# Patient Record
Sex: Female | Born: 1995
Health system: Southern US, Community
[De-identification: ages and names within clinical notes are randomized; demographics above are authoritative.]

## PROBLEM LIST (undated history)

## (undated) ENCOUNTER — Inpatient Hospital Stay (HOSPITAL_COMMUNITY): Payer: Self-pay

## (undated) DIAGNOSIS — Z973 Presence of spectacles and contact lenses: Secondary | ICD-10-CM

## (undated) DIAGNOSIS — S62334A Displaced fracture of neck of fourth metacarpal bone, right hand, initial encounter for closed fracture: Secondary | ICD-10-CM

## (undated) HISTORY — PX: WISDOM TOOTH EXTRACTION: SHX21

---
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
## 2018-05-15 LAB — OB RESULTS CONSOLE ABO/RH: RH Type: POSITIVE

## 2018-05-15 LAB — OB RESULTS CONSOLE RUBELLA ANTIBODY, IGM: Rubella: IMMUNE

## 2018-05-15 LAB — OB RESULTS CONSOLE HEPATITIS B SURFACE ANTIGEN: Hepatitis B Surface Ag: NEGATIVE

## 2018-07-18 DIAGNOSIS — Z3482 Encounter for supervision of other normal pregnancy, second trimester: Secondary | ICD-10-CM | POA: Diagnosis not present

## 2018-07-18 DIAGNOSIS — Z3480 Encounter for supervision of other normal pregnancy, unspecified trimester: Secondary | ICD-10-CM | POA: Diagnosis not present

## 2018-07-18 DIAGNOSIS — Z3201 Encounter for pregnancy test, result positive: Secondary | ICD-10-CM | POA: Diagnosis not present

## 2018-07-19 DIAGNOSIS — N761 Subacute and chronic vaginitis: Secondary | ICD-10-CM | POA: Diagnosis not present

## 2018-07-19 DIAGNOSIS — Z36 Encounter for antenatal screening for chromosomal anomalies: Secondary | ICD-10-CM | POA: Diagnosis not present

## 2018-07-19 DIAGNOSIS — Z3482 Encounter for supervision of other normal pregnancy, second trimester: Secondary | ICD-10-CM | POA: Diagnosis not present

## 2018-09-18 DIAGNOSIS — Z23 Encounter for immunization: Secondary | ICD-10-CM | POA: Diagnosis not present

## 2018-09-18 DIAGNOSIS — Z3482 Encounter for supervision of other normal pregnancy, second trimester: Secondary | ICD-10-CM | POA: Diagnosis not present

## 2018-09-18 LAB — OB RESULTS CONSOLE HIV ANTIBODY (ROUTINE TESTING): HIV: NONREACTIVE

## 2018-09-18 LAB — OB RESULTS CONSOLE RPR: RPR: NONREACTIVE

## 2018-10-03 DIAGNOSIS — Z23 Encounter for immunization: Secondary | ICD-10-CM | POA: Diagnosis not present

## 2018-11-28 NOTE — L&D Delivery Note (Addendum)
Delivery Note Upon arrival, +3 station.  Pt pushed twice.  At 8:58 PM a viable female was delivered via Vaginal, Spontaneous (Presentation: direct OA, spontaneously rotated to LOP  ).  APGAR: 6, 9; weight pending  . Nose and mouth suctioned on perineum.   Placenta status: spontaneou , intact .  Cord: loose nuchal cord x 1, manually reduced   Cord pH: venous sent.  Unable to get arterial.  NICU called prior to delivery due meconium and deceleration. They arrived at ~1 minute.    Anesthesia:  Epidural and lidocaine Episiotomy: None Lacerations: Periurethral, vaginal Suture Repair: 2.0 3.0 chromic Est. Blood Loss (mL):  498  Red rubber catheter placed in urethra after betadine applied.  Stayed in place while repair completed.  Easily removed.  Concerns for hematoma on right side but expressed minimal blood once laceration repaired.  Large hymenal tag avulsed from 7-8 o'clock.  Reapproximated after vaginal tear repaired.  Deep vaginal tear in posterior and right vagina.    Mom to postpartum.  Baby to Couplet care / Skin to Skin.  Couple desires circumcision.  Placenta sent to pathology due to fetal decelerations and it felt warm.  Maternal temp was 98.1   Geryl Rankins 12/31/2018, 9:52 PM

## 2018-11-29 DIAGNOSIS — Z3483 Encounter for supervision of other normal pregnancy, third trimester: Secondary | ICD-10-CM | POA: Diagnosis not present

## 2018-11-29 LAB — OB RESULTS CONSOLE GBS: GBS: NEGATIVE

## 2018-12-12 DIAGNOSIS — R81 Glycosuria: Secondary | ICD-10-CM | POA: Diagnosis not present

## 2018-12-21 DIAGNOSIS — R81 Glycosuria: Secondary | ICD-10-CM | POA: Diagnosis not present

## 2018-12-27 ENCOUNTER — Telehealth (HOSPITAL_COMMUNITY): Payer: Self-pay | Admitting: *Deleted

## 2018-12-27 ENCOUNTER — Encounter (HOSPITAL_COMMUNITY): Payer: Self-pay | Admitting: *Deleted

## 2018-12-27 NOTE — Telephone Encounter (Signed)
Preadmission screen  

## 2018-12-28 ENCOUNTER — Encounter (HOSPITAL_COMMUNITY): Payer: Self-pay | Admitting: *Deleted

## 2018-12-28 ENCOUNTER — Telehealth (HOSPITAL_COMMUNITY): Payer: Self-pay | Admitting: *Deleted

## 2018-12-28 ENCOUNTER — Inpatient Hospital Stay (HOSPITAL_COMMUNITY)
Admission: AD | Admit: 2018-12-28 | Discharge: 2018-12-28 | Disposition: A | Discharge status: Home / Self Care | Payer: 59 | Source: Ambulatory Visit | Attending: Obstetrics and Gynecology | Admitting: Obstetrics and Gynecology

## 2018-12-28 DIAGNOSIS — O480 Post-term pregnancy: Secondary | ICD-10-CM | POA: Diagnosis not present

## 2018-12-28 DIAGNOSIS — Z3A41 41 weeks gestation of pregnancy: Secondary | ICD-10-CM | POA: Diagnosis not present

## 2018-12-28 DIAGNOSIS — O471 False labor at or after 37 completed weeks of gestation: Secondary | ICD-10-CM | POA: Diagnosis not present

## 2018-12-28 DIAGNOSIS — O479 False labor, unspecified: Secondary | ICD-10-CM

## 2018-12-28 NOTE — MAU Provider Note (Signed)
S: Ms. Beverly Norris is a 23 y.o. G1P0 at [redacted]w[redacted]d  who presents to MAU today for labor evaluation.     Cervical exam by RN:  Dilation: 1 Effacement (%): 60 Station: -3 Exam by:: Pharmacologist  Fetal Monitoring: Baseline: 135 Variability: moderate Accelerations: present Decelerations: none Contractions: Q 7-10 minutes  MDM Discussed patient with RN. NST reviewed.   A: SIUP at [redacted]w[redacted]d  False labor  P: Discharge home Labor precautions and kick counts included in AVS Patient to follow-up with [facility] as scheduled  Patient may return to MAU as needed or when in labor   Hurshel Party, CNM 12/28/2018 5:09 AM

## 2018-12-28 NOTE — MAU Note (Signed)
PT SAYS UC STRONG SINCE 0130.PNC WITH DR Idamae Schuller-   VE IN OFFICE - CLOSED- ON Tuesday.  DENIES HSV AND MRSA. GBS-  NEG

## 2018-12-28 NOTE — Telephone Encounter (Signed)
Preadmission screen  

## 2018-12-28 NOTE — Discharge Instructions (Signed)

## 2018-12-30 ENCOUNTER — Other Ambulatory Visit: Payer: Self-pay | Admitting: Obstetrics and Gynecology

## 2018-12-31 ENCOUNTER — Encounter (HOSPITAL_COMMUNITY): Payer: Self-pay

## 2018-12-31 ENCOUNTER — Inpatient Hospital Stay (HOSPITAL_COMMUNITY): Payer: 59 | Admitting: Anesthesiology

## 2018-12-31 ENCOUNTER — Other Ambulatory Visit: Payer: Self-pay

## 2018-12-31 ENCOUNTER — Inpatient Hospital Stay (HOSPITAL_COMMUNITY)
Admission: RE | Admit: 2018-12-31 | Discharge: 2019-01-02 | DRG: 806 | Disposition: A | Discharge status: Home / Self Care | Payer: 59 | Attending: Obstetrics and Gynecology | Admitting: Obstetrics and Gynecology

## 2018-12-31 DIAGNOSIS — O480 Post-term pregnancy: Secondary | ICD-10-CM | POA: Diagnosis not present

## 2018-12-31 DIAGNOSIS — Z3A41 41 weeks gestation of pregnancy: Secondary | ICD-10-CM | POA: Diagnosis not present

## 2018-12-31 DIAGNOSIS — N898 Other specified noninflammatory disorders of vagina: Secondary | ICD-10-CM | POA: Diagnosis not present

## 2018-12-31 DIAGNOSIS — Z3A00 Weeks of gestation of pregnancy not specified: Secondary | ICD-10-CM | POA: Diagnosis not present

## 2018-12-31 DIAGNOSIS — Z3403 Encounter for supervision of normal first pregnancy, third trimester: Secondary | ICD-10-CM

## 2018-12-31 DIAGNOSIS — O411290 Chorioamnionitis, unspecified trimester, not applicable or unspecified: Secondary | ICD-10-CM | POA: Diagnosis not present

## 2018-12-31 HISTORY — DX: Encounter for supervision of normal first pregnancy, third trimester: Z34.03

## 2018-12-31 LAB — PROTEIN / CREATININE RATIO, URINE
Creatinine, Urine: 26 mg/dL
Total Protein, Urine: <6 mg/dL

## 2018-12-31 LAB — COMPREHENSIVE METABOLIC PANEL
ALBUMIN: 2.7 g/dL — AB (ref 3.5–5.0)
ALT: 10 U/L (ref 0–44)
AST: 20 U/L (ref 15–41)
Alkaline Phosphatase: 162 U/L — ABNORMAL HIGH (ref 38–126)
Anion gap: 9 (ref 5–15)
BUN: 12 mg/dL (ref 6–20)
CO2: 18 mmol/L — AB (ref 22–32)
Calcium: 9 mg/dL (ref 8.9–10.3)
Chloride: 105 mmol/L (ref 98–111)
Creatinine, Ser: 0.54 mg/dL (ref 0.44–1.00)
GFR calc Af Amer: >60 mL/min (ref >60)
GFR calc non Af Amer: >60 mL/min (ref >60)
GLUCOSE: 97 mg/dL (ref 70–99)
Potassium: 4 mmol/L (ref 3.5–5.1)
SODIUM: 132 mmol/L — AB (ref 135–145)
Total Bilirubin: 0.4 mg/dL (ref 0.3–1.2)
Total Protein: 6.2 g/dL — ABNORMAL LOW (ref 6.5–8.1)

## 2018-12-31 LAB — CBC
HCT: 39.9 % (ref 36.0–46.0)
Hemoglobin: 13.5 g/dL (ref 12.0–15.0)
MCH: 31.7 pg (ref 26.0–34.0)
MCHC: 33.8 g/dL (ref 30.0–36.0)
MCV: 93.7 fL (ref 80.0–100.0)
Platelets: 279 10*3/uL (ref 150–400)
RBC: 4.26 MIL/uL (ref 3.87–5.11)
RDW: 13.2 % (ref 11.5–15.5)
WBC: 9.4 10*3/uL (ref 4.0–10.5)
nRBC: 0 % (ref 0.0–0.2)

## 2018-12-31 LAB — TYPE AND SCREEN
ABO/RH(D): A POS
Antibody Screen: NEGATIVE

## 2018-12-31 LAB — ABO/RH: ABO/RH(D): A POS

## 2018-12-31 MED ORDER — OXYCODONE HCL 5 MG PO TABS: 10.0000 mg | ORAL_TABLET | ORAL | Status: DC | PRN

## 2018-12-31 MED ORDER — OXYTOCIN 40 UNITS IN NORMAL SALINE INFUSION - SIMPLE MED
1.0000 m[IU]/min | INTRAVENOUS | Status: DC
  Administered 2018-12-31: 1 m[IU]/min via INTRAVENOUS
  Filled 2018-12-31: qty 1000

## 2018-12-31 MED ORDER — BENZOCAINE-MENTHOL 20-0.5 % EX AERO
1.0000 "application " | INHALATION_SPRAY | CUTANEOUS | Status: DC | PRN
  Administered 2019-01-01 – 2019-01-02 (×2): 1 via TOPICAL
  Filled 2018-12-31 (×2): qty 56

## 2018-12-31 MED ORDER — TETANUS-DIPHTH-ACELL PERTUSSIS 5-2.5-18.5 LF-MCG/0.5 IM SUSP: 0.5000 mL | Freq: Once | INTRAMUSCULAR | Status: DC

## 2018-12-31 MED ORDER — MISOPROSTOL 25 MCG QUARTER TABLET
25.0000 ug | ORAL_TABLET | ORAL | Status: DC | PRN
  Filled 2018-12-31: qty 1

## 2018-12-31 MED ORDER — MISOPROSTOL 200 MCG PO TABS: 800.0000 ug | ORAL_TABLET | Freq: Once | ORAL | Status: DC | PRN

## 2018-12-31 MED ORDER — DIPHENHYDRAMINE HCL 25 MG PO CAPS: 25.0000 mg | ORAL_CAPSULE | Freq: Four times a day (QID) | ORAL | Status: DC | PRN

## 2018-12-31 MED ORDER — ACETAMINOPHEN 325 MG PO TABS: 650.0000 mg | ORAL_TABLET | ORAL | Status: DC | PRN

## 2018-12-31 MED ORDER — LACTATED RINGERS IV SOLN
500.0000 mL | INTRAVENOUS | Status: DC | PRN
  Administered 2018-12-31 (×2): 500 mL via INTRAVENOUS

## 2018-12-31 MED ORDER — HYDROXYZINE HCL 50 MG PO TABS
50.0000 mg | ORAL_TABLET | Freq: Four times a day (QID) | ORAL | Status: DC | PRN
  Filled 2018-12-31: qty 1

## 2018-12-31 MED ORDER — EPHEDRINE 5 MG/ML INJ
10.0000 mg | INTRAVENOUS | Status: DC | PRN
  Filled 2018-12-31: qty 2

## 2018-12-31 MED ORDER — TERBUTALINE SULFATE 1 MG/ML IJ SOLN
0.2500 mg | Freq: Once | INTRAMUSCULAR | Status: DC | PRN
  Filled 2018-12-31: qty 1

## 2018-12-31 MED ORDER — OXYCODONE HCL 5 MG PO TABS: 5.0000 mg | ORAL_TABLET | ORAL | Status: DC | PRN

## 2018-12-31 MED ORDER — WITCH HAZEL-GLYCERIN EX PADS
1.0000 "application " | MEDICATED_PAD | CUTANEOUS | Status: DC | PRN
  Administered 2019-01-02: 1 via TOPICAL

## 2018-12-31 MED ORDER — COCONUT OIL OIL
1.0000 "application " | TOPICAL_OIL | Status: DC | PRN
  Administered 2019-01-01: 1 via TOPICAL
  Filled 2018-12-31: qty 120

## 2018-12-31 MED ORDER — ONDANSETRON HCL 4 MG PO TABS: 4.0000 mg | ORAL_TABLET | ORAL | Status: DC | PRN

## 2018-12-31 MED ORDER — PHENYLEPHRINE 40 MCG/ML (10ML) SYRINGE FOR IV PUSH (FOR BLOOD PRESSURE SUPPORT)
80.0000 ug | PREFILLED_SYRINGE | INTRAVENOUS | Status: DC | PRN
  Filled 2018-12-31 (×2): qty 10

## 2018-12-31 MED ORDER — OXYTOCIN BOLUS FROM INFUSION
500.0000 mL | Freq: Once | INTRAVENOUS | Status: AC
  Administered 2018-12-31: 500 mL via INTRAVENOUS

## 2018-12-31 MED ORDER — SIMETHICONE 80 MG PO CHEW: 80.0000 mg | CHEWABLE_TABLET | ORAL | Status: DC | PRN

## 2018-12-31 MED ORDER — LIDOCAINE-EPINEPHRINE (PF) 2 %-1:200000 IJ SOLN
INTRAMUSCULAR | Status: DC | PRN
  Administered 2018-12-31: 4 mL via EPIDURAL
  Administered 2018-12-31: 3 mL via EPIDURAL

## 2018-12-31 MED ORDER — SOD CITRATE-CITRIC ACID 500-334 MG/5ML PO SOLN: 30.0000 mL | ORAL | Status: DC | PRN

## 2018-12-31 MED ORDER — SENNOSIDES-DOCUSATE SODIUM 8.6-50 MG PO TABS
2.0000 | ORAL_TABLET | ORAL | Status: DC
  Administered 2019-01-01 (×2): 2 via ORAL
  Filled 2018-12-31 (×2): qty 2

## 2018-12-31 MED ORDER — PRENATAL MULTIVITAMIN CH
1.0000 | ORAL_TABLET | Freq: Every day | ORAL | Status: DC
  Administered 2019-01-01 – 2019-01-02 (×2): 1 via ORAL
  Filled 2018-12-31 (×2): qty 1

## 2018-12-31 MED ORDER — DIBUCAINE 1 % RE OINT: 1.0000 "application " | TOPICAL_OINTMENT | RECTAL | Status: DC | PRN

## 2018-12-31 MED ORDER — ZOLPIDEM TARTRATE 5 MG PO TABS: 5.0000 mg | ORAL_TABLET | Freq: Every evening | ORAL | Status: DC | PRN

## 2018-12-31 MED ORDER — ONDANSETRON HCL 4 MG/2ML IJ SOLN
4.0000 mg | Freq: Four times a day (QID) | INTRAMUSCULAR | Status: DC | PRN
  Administered 2018-12-31: 4 mg via INTRAVENOUS
  Filled 2018-12-31: qty 2

## 2018-12-31 MED ORDER — OXYTOCIN 40 UNITS IN NORMAL SALINE INFUSION - SIMPLE MED: 2.5000 [IU]/h | INTRAVENOUS | Status: DC | PRN

## 2018-12-31 MED ORDER — IBUPROFEN 600 MG PO TABS
600.0000 mg | ORAL_TABLET | Freq: Four times a day (QID) | ORAL | Status: DC
  Administered 2019-01-01 – 2019-01-02 (×8): 600 mg via ORAL
  Filled 2018-12-31 (×8): qty 1

## 2018-12-31 MED ORDER — OXYCODONE-ACETAMINOPHEN 5-325 MG PO TABS: 1.0000 | ORAL_TABLET | ORAL | Status: DC | PRN

## 2018-12-31 MED ORDER — OXYCODONE-ACETAMINOPHEN 5-325 MG PO TABS: 2.0000 | ORAL_TABLET | ORAL | Status: DC | PRN

## 2018-12-31 MED ORDER — ONDANSETRON HCL 4 MG/2ML IJ SOLN: 4.0000 mg | INTRAMUSCULAR | Status: DC | PRN

## 2018-12-31 MED ORDER — OXYTOCIN 40 UNITS IN NORMAL SALINE INFUSION - SIMPLE MED: 2.5000 [IU]/h | INTRAVENOUS | Status: DC

## 2018-12-31 MED ORDER — DIPHENHYDRAMINE HCL 50 MG/ML IJ SOLN: 12.5000 mg | INTRAMUSCULAR | Status: DC | PRN

## 2018-12-31 MED ORDER — BUTORPHANOL TARTRATE 1 MG/ML IJ SOLN
1.0000 mg | INTRAMUSCULAR | Status: DC | PRN
  Administered 2018-12-31 (×3): 1 mg via INTRAVENOUS
  Filled 2018-12-31 (×3): qty 1

## 2018-12-31 MED ORDER — MAGNESIUM HYDROXIDE 400 MG/5ML PO SUSP: 30.0000 mL | ORAL | Status: DC | PRN

## 2018-12-31 MED ORDER — FERROUS SULFATE 325 (65 FE) MG PO TABS
325.0000 mg | ORAL_TABLET | Freq: Two times a day (BID) | ORAL | Status: DC
  Administered 2019-01-01 – 2019-01-02 (×4): 325 mg via ORAL
  Filled 2018-12-31 (×4): qty 1

## 2018-12-31 MED ORDER — LACTATED RINGERS AMNIOINFUSION
INTRAVENOUS | Status: DC | PRN
  Administered 2018-12-31: 14:00:00 via INTRAUTERINE
  Filled 2018-12-31 (×2): qty 1000

## 2018-12-31 MED ORDER — PHENYLEPHRINE 40 MCG/ML (10ML) SYRINGE FOR IV PUSH (FOR BLOOD PRESSURE SUPPORT)
80.0000 ug | PREFILLED_SYRINGE | INTRAVENOUS | Status: DC | PRN
  Administered 2018-12-31 (×2): 80 ug via INTRAVENOUS
  Filled 2018-12-31: qty 10

## 2018-12-31 MED ORDER — LACTATED RINGERS IV SOLN
INTRAVENOUS | Status: DC
  Administered 2018-12-31 (×5): via INTRAVENOUS

## 2018-12-31 MED ORDER — FENTANYL 2.5 MCG/ML BUPIVACAINE 1/10 % EPIDURAL INFUSION (WH - ANES)
14.0000 mL/h | INTRAMUSCULAR | Status: DC | PRN
  Administered 2018-12-31 (×2): 14 mL/h via EPIDURAL
  Filled 2018-12-31 (×2): qty 100

## 2018-12-31 MED ORDER — LIDOCAINE HCL (PF) 1 % IJ SOLN
30.0000 mL | INTRAMUSCULAR | Status: AC | PRN
  Administered 2018-12-31: 30 mL via SUBCUTANEOUS
  Filled 2018-12-31: qty 30

## 2018-12-31 MED ORDER — LACTATED RINGERS IV SOLN: 500.0000 mL | Freq: Once | INTRAVENOUS | Status: DC

## 2018-12-31 NOTE — Progress Notes (Signed)
Beverly Norris is a 23 y.o. G2P0010 at [redacted]w[redacted]d   Subjective: Pt denies pain currently due to IV Stadol.  Objective: BP 119/86   Pulse 75   Temp 98.5 F (36.9 C) (Oral)   Resp 18   Ht 5\' 3"  (1.6 m)   Wt 81.5 kg   LMP 03/16/2018   BMI 31.83 kg/m  No intake/output data recorded. No intake/output data recorded.  FHT:  FHR: 130s bpm, variability: moderate,  accelerations:  Present,  decelerations:  Present Prolonged with good variability during deceleration. UC:   irregular, every 6 minutes SVE:   Dilation: 6 Effacement (%): 80 Station: -2 Exam by:: Jonelle Sidle, RN Deferred cervical exam. Neuro:  2-3+ DTR.  Labs: Lab Results  Component Value Date   WBC 9.4 12/31/2018   HGB 13.5 12/31/2018   HCT 39.9 12/31/2018   MCV 93.7 12/31/2018   PLT 279 12/31/2018    Assessment / Plan: IUP @ 41 3/7 weeks  Labor: Protracted, 2 cm in ~8 hours.  Start low dose Pitocin to augment labor. Preeclampsia:  BP normal currently.  Preeclampsia labs negative. Monitor closely. Fetal Wellbeing:  Category II Pain Control:  Considering epidural.  Hold Stadol right now due to fetal deceleration. I/D:  n/a Anticipated MOD:  NSVD if fetus tolerates labor.  Geryl Rankins 12/31/2018, 8:23 AM

## 2018-12-31 NOTE — Progress Notes (Signed)
Pt comfortable s/p epidural.  Per RN, decelerations noted at 2 mUs of Pitocin.  Also noted after epidural.  Has had good variability.    BP 114/66   Pulse 62   Temp 98.9 F (37.2 C) (Oral)   Resp 18   Ht 5\' 3"  (1.6 m)   Wt 81.5 kg   LMP 03/16/2018   SpO2 98%   BMI 31.83 kg/m    Gen:NAD  Dilation: 6 Effacement (%): 70 Cervical Position: Middle Station: -2 Presentation: Vertex Exam by:: Dr. Dion Body  AROM:  Light meconium.  IUPC placed due to protracted labor.  D/w pt.  A/P IUP @ 41 3/7 weeks Category II tracing intermittently.  Currently Category I.  Amnioinfusion prn variable decelerations.  Position changes prn.   Discussed fetal intolerance to labor. Protracted labor  Resume Pitocin if inadequate MVUs after AROM. Elevated BP on admission.  Neg Preeclampsia labs. Epidural.

## 2018-12-31 NOTE — Anesthesia Pain Management Evaluation Note (Signed)
  CRNA Pain Management Visit Note  Patient: Beverly Norris, 23 y.o., female  "Hello I am a member of the anesthesia team at [facility]. We have an anesthesia team available at all times to provide care throughout the hospital, including epidural management and anesthesia for C-section. I don't know your plan for the delivery whether it a natural birth, water birth, IV sedation, nitrous supplementation, doula or epidural, but we want to meet your pain goals."   1.Was your pain managed to your expectations on prior hospitalizations?   No prior hospitalizations  2.What is your expectation for pain management during this hospitalization?     Labor support without medications, Epidural, IV pain meds and Nitrous Oxide  3.How can we help you reach that goal? Pt unsure of labor epidural at this time. She was open to discussion of all methods of pain control  Record the patient's initial score and the patient's pain goal.   Pain: 6  Pain Goal: 6 [facility] wants you to be able to say your pain was always managed very well.  [name] 12/31/2018

## 2018-12-31 NOTE — Progress Notes (Signed)
Labor Progress Note ia Claywell is a 23 y.o. female, G2P0 at 41+3  Subjective:  Ms. Beverly Norris states that her contractions are painful. She has had 3 doses of stadol so far. We have discussed the use of nitrous oxide. She is trying not to use an epidural for pain relief, at least for now.   Objective:  BP 133/78   Pulse 63   Temp 98.1 F (36.7 C) (Oral)   Resp 18   Ht 5\' 3"  (1.6 m)   Wt 81.5 kg   LMP 03/16/2018   BMI 31.83 kg/m     FHT: Category 2: Strip continues to show variable decels at variable intervels which resolve with repositioning, most are an hour apart and lasting 1 minute.  Fluid bolus x 2 since midnight for intrauterine resuscitation. Otherwise + for accels and moderate variability. No other decelerations present. UC:   irregular, every 2-6 minutes SVE:   Dilation: 6 Effacement (%): 80 Station: -2 Exam by:: Jonelle Sidle, RN Pitocin at n/a MVUs n/a  Assessment/Plan:  Fetal Wellbeing: Alternating Cat 1/Cat 2 (as above) Labor: Progressing, unaugmented 6/80/-2 Preeclampsia:  PIH labs negative, B/P beginning to normalize GBS:   neg Pain Control:  IV stadol x 3 Anticipated MOD:  SVD    Jonetta Speak, CNM 12/31/2018, 6:20 AM

## 2018-12-31 NOTE — Progress Notes (Signed)
Pt comfortable s/p epidural.  Per RN, decelerations noted at 2 mUs of Pitocin.  Also noted after epidural.  Has had good variability.    BP 135/84   Pulse 73   Temp 98.5 F (36.9 C) (Oral)   Resp 20   Ht 5\' 3"  (1.6 m)   Wt 81.5 kg   LMP 03/16/2018   SpO2 98%   BMI 31.83 kg/m    Gen:NAD Cervix: 9/100/+1-+2, thick anterior lip  EM:  120-130s, moderate variability, accelerations present.  Moderate variable decelerations, contractions q 1-2 min  A/P IUP @ 41 3/7 weeks Category II tracing intermittently.  Currently Category I.  Continue Amnioinfusion for variable decelerations.  Position changes prn.   Discussed VAVD prn.  Tachysystole noted.  Decrease Pitocin to 5 mUs. Protracted labor  Adequate MVUs.  Appropriate cervical change.  Start pushing once completely dilated. Elevated BP on admission.  Neg Preeclampsia labs.  BP normal. Epidural.

## 2018-12-31 NOTE — Anesthesia Procedure Notes (Signed)
Epidural Patient location during procedure: OB Start time: 12/31/2018 12:25 PM End time: 12/31/2018 12:40 PM  Staffing Anesthesiologist: Elmer PickerWoodrum, Kisa Fujii L, MD Performed: anesthesiologist   Preanesthetic Checklist Completed: patient identified, pre-op evaluation, timeout performed, IV checked, risks and benefits discussed and monitors and equipment checked  Epidural Patient position: sitting Prep: site prepped and draped and DuraPrep Patient monitoring: continuous pulse ox, blood pressure, heart rate and cardiac monitor Approach: midline Location: L3-L4 Injection technique: LOR air  Needle:  Needle type: Tuohy  Needle gauge: 17 G Needle length: 9 cm Needle insertion depth: 5 cm Catheter type: closed end flexible Catheter size: 19 Gauge Catheter at skin depth: 10 cm Test dose: negative  Assessment Sensory level: T8 Events: blood not aspirated, injection not painful, no injection resistance, negative IV test and no paresthesia  Additional Notes Patient identified. Risks/Benefits/Options discussed with patient including but not limited to bleeding, infection, nerve damage, paralysis, failed block, incomplete pain control, headache, blood pressure changes, nausea, vomiting, reactions to medication both or allergic, itching and postpartum back pain. Confirmed with bedside nurse the patient's most recent platelet count. Confirmed with patient that they are not currently taking any anticoagulation, have any bleeding history or any family history of bleeding disorders. Patient expressed understanding and wished to proceed. All questions were answered. Sterile technique was used throughout the entire procedure. Please see nursing notes for vital signs. Test dose was given through epidural catheter and negative prior to continuing to dose epidural or start infusion. Warning signs of high block given to the patient including shortness of breath, tingling/numbness in hands, complete motor block,  or any concerning symptoms with instructions to call for help. Patient was given instructions on fall risk and not to get out of bed. All questions and concerns addressed with instructions to call with any issues or inadequate analgesia.  Reason for block:procedure for pain

## 2018-12-31 NOTE — H&P (Addendum)
Beverly Norris is a 23 y.o. female, G2P0 at 41+3 weeks, presenting for IOL for post dates. She is seen by Dr. Dion Body at [facility]. She presents intact, 4/23/-2, and contracting some on her own. She states the contractions rate a 7 out of 10 on the pain scale, brought down to a 5 by stadol.  Patient Active Problem List   Diagnosis Date Noted  . Supervision of low-risk first pregnancy, third trimester 12/31/2018    History of present pregnancy: Patient entered care at 17+6 weeks.   EDC of 12/23/18 was established by LMP.   Anatomy scan: need [facility] records Significant prenatal events:  none   Last evaluation:  12/25/18 ROB  OB History    Gravida  1   Para      Term      Preterm      AB      Living        SAB      TAB      Ectopic      Multiple      Live Births             History reviewed. No pertinent past medical history. Past Surgical History:  Procedure Laterality Date  . WISDOM TOOTH EXTRACTION     Family History: family history includes Diabetes in her maternal grandfather, maternal grandmother, mother, paternal grandfather, and paternal grandmother. Social History:  reports that she has never smoked. She has never used smokeless tobacco. She reports that she does not drink alcohol or use drugs.   Prenatal Transfer Tool  Maternal Diabetes: No Genetic Screening:  Maternal Ultrasounds/Referrals:  Fetal Ultrasounds or other Referrals:   Maternal Substance Abuse:  No Significant Maternal Medications:  None Significant Maternal Lab Results: Lab values include: Group B Strep negative  TDAP yes Flu yes  ROS:  All ten systems reviewed and negative, except as noted. Denies VB, LOF.  Denies headache, epigastric pain, and visual sx. Reports good FM.   No Known Allergies   Dilation: 4 Effacement (%): 80 Station: -2 Exam by:: Jonelle Sidle, RN Blood pressure (!) 141/89, pulse 63, temperature 98.3 F (36.8 C), temperature source Oral, resp. rate  16, height 5\' 3"  (1.6 m), weight 81.5 kg, last menstrual period 03/16/2018.  Vitals:   12/31/18 0128 12/31/18 0132 12/31/18 0146 12/31/18 0201  BP: (!) 154/94 (!) 162/62 124/86 (!) 141/89  Pulse: 71 67 72 63  Resp: 17 17 16 16   Temp:      TempSrc:      Weight:      Height:       Results for orders placed or performed during the hospital encounter of 12/31/18 (from the past 24 hour(s))  CBC     Status: None   Collection Time: 12/31/18 12:52 AM  Result Value Ref Range   WBC 9.4 4.0 - 10.5 K/uL   RBC 4.26 3.87 - 5.11 MIL/uL   Hemoglobin 13.5 12.0 - 15.0 g/dL   HCT 95.6 38.7 - 56.4 %   MCV 93.7 80.0 - 100.0 fL   MCH 31.7 26.0 - 34.0 pg   MCHC 33.8 30.0 - 36.0 g/dL   RDW 33.2 95.1 - 88.4 %   Platelets 279 150 - 400 K/uL   nRBC 0.0 0.0 - 0.2 %  Type and screen [facility]     Status: None   Collection Time: 12/31/18 12:52 AM  Result Value Ref Range   ABO/RH(D) A POS    Antibody  Screen NEG    Sample Expiration      01/03/2019 Performed at [facility], [address] [place], [place] [address]     Chest clear Heart RRR without murmur Abd gravid, NT Pelvic: adequate Ext: No signs or symptoms of DVT  FHR: Category 1, reactive.Since admission there have been a few variable decels from 120 down to 80 which resolve within 1 minute with repositioning. UCs:  1 in 10 minutes  Prenatal labs: ABO, Rh: --/--/A POS (02/03 0052) Antibody: NEG (02/03 0052) Rubella:    RPR: Nonreactive (10/22 0000)  HBsAg:    HIV: Non-reactive (10/22 0000)  GBS: Negative (01/02 0000) Genetic screenings:  unk Glucola:  106 Hgb 10.8 at NOB,  Assessment: 23 y.o., G2P0 at [redacted]w[redacted]d IOL for post dates FWB: Overall reassuring tracing-though category 2 at times for variable decels  Plan: Admit to [facility] per consult with Dr. Mora Appl Routine [facility] orders Pain med/epidural prn (undecided) Start pitocin in the morning if no cervical change Monitor BP closely (one severe  range BP at 0132, several mild ranges) PCR & CMP pending   Jonetta Speak, CNM 12/31/2018, 2:15 AM

## 2018-12-31 NOTE — Anesthesia Preprocedure Evaluation (Addendum)
Anesthesia Evaluation  Patient identified by MRN, date of birth, ID band Patient awake    Reviewed: Allergy & Precautions, NPO status , Patient's Chart, lab work & pertinent test results  Airway Mallampati: I  TM Distance: >3 FB Neck ROM: Full    Dental no notable dental hx.    Pulmonary neg pulmonary ROS,    Pulmonary exam normal breath sounds clear to auscultation       Cardiovascular negative cardio ROS Normal cardiovascular exam Rhythm:Regular Rate:Normal     Neuro/Psych negative neurological ROS  negative psych ROS   GI/Hepatic negative GI ROS, Neg liver ROS,   Endo/Other  negative endocrine ROS  Renal/GU negative Renal ROS  negative genitourinary   Musculoskeletal negative musculoskeletal ROS (+)   Abdominal   Peds  Hematology negative hematology ROS (+)   Anesthesia Other Findings   Reproductive/Obstetrics (+) Pregnancy                             Anesthesia Physical Anesthesia Plan  ASA: II  Anesthesia Plan: Epidural   Post-op Pain Management:    Induction:   PONV Risk Score and Plan: Treatment may vary due to age or medical condition  Airway Management Planned: Natural Airway  Additional Equipment:   Intra-op Plan:   Post-operative Plan:   Informed Consent: I have reviewed the patients History and Physical, chart, labs and discussed the procedure including the risks, benefits and alternatives for the proposed anesthesia with the patient or authorized representative who has indicated his/her understanding and acceptance.       Plan Discussed with: Anesthesiologist  Anesthesia Plan Comments: (Patient identified. Risks, benefits, options discussed with patient including but not limited to bleeding, infection, nerve damage, paralysis, failed block, incomplete pain control, headache, blood pressure changes, nausea, vomiting, reactions to medication, itching, and  post partum back pain. Confirmed with bedside nurse the patient's most recent platelet count. Confirmed with the patient that they are not taking any anticoagulation, have any bleeding history or any family history of bleeding disorders. Patient expressed understanding and wishes to proceed. All questions were answered. )        Anesthesia Quick Evaluation  

## 2019-01-01 ENCOUNTER — Encounter (HOSPITAL_COMMUNITY): Payer: Self-pay

## 2019-01-01 LAB — CBC
HCT: 37.3 % (ref 36.0–46.0)
Hemoglobin: 12.6 g/dL (ref 12.0–15.0)
MCH: 31.4 pg (ref 26.0–34.0)
MCHC: 33.8 g/dL (ref 30.0–36.0)
MCV: 93 fL (ref 80.0–100.0)
NRBC: 0 % (ref 0.0–0.2)
Platelets: 223 10*3/uL (ref 150–400)
RBC: 4.01 MIL/uL (ref 3.87–5.11)
RDW: 13.2 % (ref 11.5–15.5)
WBC: 17.2 10*3/uL — AB (ref 4.0–10.5)

## 2019-01-01 LAB — RPR: RPR Ser Ql: NONREACTIVE

## 2019-01-01 NOTE — Lactation Note (Signed)
This note was copied from a baby's chart. Lactation Consultation Note  Patient Name: Beverly Norris ZOXWR'UToday's Date: 01/01/2019 Reason for consult: Initial assessment Baby is 15 hours old.  Mom reports that baby is latching easily but sometimes it hurts initially.  RN assisted mom this morning with feeding.  Baby just finished a feeding and is sleeping in FOB's arms.  Instructed to feed with feeding cues and call for assist prn.  Lactation services and support information given and reviewed.  Maternal Data    Feeding Feeding Type: Breast Fed  LATCH Score                   Interventions    Lactation Tools Discussed/Used     Consult Status Consult Status: Follow-up Date: 01/02/19 Follow-up type: In-patient    [name], [name] 01/01/2019, 12:03 PM

## 2019-01-01 NOTE — Progress Notes (Signed)
CSW received consult for hx of Anxiety.  CSW met with MOB to offer support and complete assessment.    When CSW arrived MOB was bonding with infant as evidence by engaging in skin to skin; MOB and infant both appeared comfortable.  FOB was also present and was asleep in the recliner.  CSW explained CSW's role and MOB gave CSW permission to complete the assessment while FOB was present. MOB was soft spoken but was easy to engage and was receptive to meeting with CSW.   CSW asked about MOB's MH hx and MOB denied having a dx of anxiety however expressed having increased racing thoughts and feeling over being overwhelmed during her 2nd trimester. MOB denied having any current symptoms.  CSW provided education regarding the baby blues period vs. perinatal mood disorders, discussed treatment and gave resources for mental health follow up if concerns arise.  CSW recommends self-evaluation during the postpartum time period using the New Mom Checklist from Postpartum Progress and encouraged MOB to contact a medical professional if symptoms are noted at any time.  CSW assessed for safety and MOB denied SI and HI.  CSW offered MOB resources for outpatient counseling and MOB declined.  MOB reported having all essential items for infant and expressed feeling prepared to parent.   CSW identifies no further need for intervention and no barriers to discharge at this time.  Laurey Arrow, MSW, LCSW Clinical Social Work [phone]

## 2019-01-01 NOTE — Progress Notes (Signed)
Postpartum day #1, NSVD  Subjective Pt without complaints.  Lochia normal.  Pain controlled.  Breast feeding yes  Temp:  [97.8 F (36.6 C)-99.2 F (37.3 C)] 98.6 F (37 C) (02/04 1359) Pulse Rate:  [50-101] 70 (02/04 1359) Resp:  [15-20] 18 (02/04 0930) BP: (106-157)/(55-129) 113/68 (02/04 1359) SpO2:  [98 %] 98 % (02/04 1359)  Gen:  NAD, A&O x 3 Uterine fundus:  Firm, nontender Lochia normal  Labia:appriorate, no hematoma noted Ext:  +Edema, no calf tenderness bilaterally  CBC    Component Value Date/Time   WBC 17.2 (H) 01/01/2019 0527   RBC 4.01 01/01/2019 0527   HGB 12.6 01/01/2019 0527   HCT 37.3 01/01/2019 0527   PLT 223 01/01/2019 0527   MCV 93.0 01/01/2019 0527   MCH 31.4 01/01/2019 0527   MCHC 33.8 01/01/2019 0527   RDW 13.2 01/01/2019 0527     A/P: S/p SVD doing well. Routine postpartum care. Lactation support. Discharge in am. Pt desires circumcision.    Beverly Norris 01/01/2019, 3:42 PM

## 2019-01-01 NOTE — Lactation Note (Signed)
This note was copied from a baby's chart. Lactation Consultation Note  Patient Name: Beverly Norris Date: 01/01/2019 Reason for consult: Initial assessment;Term  P1, 24 hour female infant, weight loss 10% Mom requested LC services due difficulties with latching infant to breast. Mom will redness and abrasions on nipples was given 16 mm NS by nurse.  Per mom, she has size 20 breast flanges.  Mom attempting to latch infant on right breast using football hold [facility] notice infant was only on tip of mom's nipple. LC asked mom hand express only smear of colostrum was present from both breast at this time. Mom requesting formula to supplement LEAD and formula risk explained. Mom only plans to use formula (one time only).  Supplementing guidelines explained and mom will only give 7 ml of Gerber gentle with iron through NS and curve tip syringe.  Mom latched infant on left breast with NS and 0.5 ml of colostrum put in NS, infant latched without difficulty and with deep latch. Per mom, she did not feel pain with latch and infant sustained latched and was vigorously  suckling at breast even after formula was no longer present in NS. Infant was breastfeeding for 15 minutes and was still breastfeeding  when LC left room.  Mom shown how to use DEBP & how to disassemble, clean, & reassemble parts. Mom will pump every 3 hours for 15 minutes on initial setting.  Mom's plan: 1. Mom will breastfeed according hunger cues and not exceed 3 hours without breastfeeding infant. 2. Mom will use 16 mm NS when latching infant to breast and she prefers to use cross cradle hold infant is latching without difficulty with this position. 3. Mom will pump every 3 hours after breastfeeding to help with milk stimulation and induction.  Maternal Data Formula Feeding for Exclusion: No Has patient been taught Hand Expression?: Yes(Mom hand expressed smear of colostrum present at this time.) Does the patient have  breastfeeding experience prior to this delivery?: No  Feeding Feeding Type: Breast Fed  LATCH Score Latch: Grasps breast easily, tongue down, lips flanged, rhythmical sucking.  Audible Swallowing: Spontaneous and intermittent  Type of Nipple: Everted at rest and after stimulation  Comfort (Breast/Nipple): Filling, red/small blisters or bruises, mild/mod discomfort  Hold (Positioning): Assistance needed to correctly position infant at breast and maintain latch.  LATCH Score: 8  Interventions Interventions: Assisted with latch;Skin to skin;Breast massage;Hand express;Breast compression;DEBP;Adjust position;Support pillows  Lactation Tools Discussed/Used Tools: Pump;Nipple Shields Breast pump type: Double-Electric Breast Pump   Consult Status Consult Status: Follow-up Date: 01/02/19 Follow-up type: In-patient    Danelle Earthly 01/01/2019, 9:28 PM

## 2019-01-01 NOTE — Addendum Note (Signed)
Addendum  created 01/01/19 0841 by Graciela Husbands, CRNA   Charge Capture section accepted, Clinical Note Signed

## 2019-01-01 NOTE — Anesthesia Postprocedure Evaluation (Signed)
Anesthesia Post Note  Patient: Beverly Norris  Procedure(s) Performed: AN AD HOC LABOR EPIDURAL     Patient location during evaluation: Mother Baby Anesthesia Type: Epidural Level of consciousness: awake and alert and oriented Pain management: satisfactory to patient Vital Signs Assessment: post-procedure vital signs reviewed and stable Respiratory status: respiratory function stable Cardiovascular status: stable Postop Assessment: no headache, no backache, epidural receding, patient able to bend at knees, no signs of nausea or vomiting and adequate PO intake Anesthetic complications: no    Last Vitals:  Vitals:   01/01/19 0015 01/01/19 0617  BP: 121/65 126/78  Pulse: 65 (!) 57  Resp: 18 18  Temp: 37.3 C 37.1 C  SpO2:      Last Pain:  Vitals:   01/01/19 0617  TempSrc: Oral  PainSc: 0-No pain   Pain Goal:                   Avital Dancy

## 2019-01-01 NOTE — Anesthesia Postprocedure Evaluation (Signed)
Anesthesia Post Note  Patient: Beverly Norris  Procedure(s) Performed: AN AD HOC LABOR EPIDURAL     Patient location during evaluation: Mother Baby Anesthesia Type: Epidural Level of consciousness: awake and alert Pain management: pain level controlled Vital Signs Assessment: post-procedure vital signs reviewed and stable Respiratory status: spontaneous breathing, nonlabored ventilation and respiratory function stable Cardiovascular status: stable Postop Assessment: no headache, no backache and epidural receding Anesthetic complications: no    Last Vitals:  Vitals:   12/31/18 2335 01/01/19 0015  BP: (!) 147/74 121/65  Pulse: 63 65  Resp: 18 18  Temp: 37.3 C 37.3 C  SpO2:      Last Pain:  Vitals:   01/01/19 0015  TempSrc: Oral  PainSc: 0-No pain   Pain Goal:                   [name]

## 2019-01-02 MED ORDER — PRENATAL 27-0.8 MG PO TABS: 1.0000 | ORAL_TABLET | Freq: Every day | ORAL | 0 refills | Status: DC

## 2019-01-02 MED ORDER — IBUPROFEN 600 MG PO TABS: 600.0000 mg | ORAL_TABLET | Freq: Four times a day (QID) | ORAL | 0 refills | Status: DC

## 2019-01-02 NOTE — Discharge Summary (Signed)
OB Discharge Summary     Patient Name: Beverly Norris DOB: 1996/01/24 MRN: [id number]  Date of admission: 12/31/2018 Delivering MD: Geryl Rankins   Date of discharge: 01/02/2019  Admitting diagnosis: INDUCTION Intrauterine pregnancy: [redacted]w[redacted]d     Secondary diagnosis:  Active Problems:   Supervision of low-risk first pregnancy, third trimester  Additional problems: Elevated BP on admission, Negative Preeclampsia labs,  did not meet criteria for Gest HTN     Discharge diagnosis: Term Pregnancy Delivered                                                                                                Post partum procedures:None  Augmentation: AROM and Pitocin  Complications: None  Hospital course:  Induction of Labor With Vaginal Delivery   23 y.o. yo G2P1011 at [redacted]w[redacted]d was admitted to the hospital 12/31/2018 for induction of labor.  Indication for induction: Postdates.  Patient had an uncomplicated labor course as follows: Membrane Rupture Time/Date: 1:10 PM ,12/31/2018   Intrapartum Procedures: Episiotomy: None [1]                                         Lacerations:  Periurethral [8];Vaginal [6]  Patient had delivery of a Viable infant.  Information for the patient's newborn:  Seeley, Karmann [[id number]]  Delivery Method: Vaginal, Spontaneous(Filed from Delivery Summary)   12/31/2018  Details of delivery can be found in separate delivery note.  Patient had a routine postpartum course. Patient is discharged home 01/02/19.  Physical exam  Vitals:   01/01/19 0930 01/01/19 1359 01/01/19 2225 01/02/19 0604  BP: 121/68 113/68 123/82 124/84  Pulse: 69 70 (!) 57 69  Resp: 18  18 18   Temp: 98.9 F (37.2 C) 98.6 F (37 C) 97.8 F (36.6 C) 98.3 F (36.8 C)  TempSrc: Oral Oral Oral Oral  SpO2: 98% 98%    Weight:      Height:       General: alert, cooperative and no distress Lochia: appropriate Uterine Fundus: firm Incision: N/A DVT Evaluation: No evidence of DVT  seen on physical exam. Calf/Ankle edema is present Labs: Lab Results  Component Value Date   WBC 17.2 (H) 01/01/2019   HGB 12.6 01/01/2019   HCT 37.3 01/01/2019   MCV 93.0 01/01/2019   PLT 223 01/01/2019   CMP Latest Ref Rng & Units 12/31/2018  Glucose 70 - 99 mg/dL 97  BUN 6 - 20 mg/dL 12  Creatinine 1.88 - 6.77 mg/dL 3.73  Sodium 668 - 159 mmol/L 132(L)  Potassium 3.5 - 5.1 mmol/L 4.0  Chloride 98 - 111 mmol/L 105  CO2 22 - 32 mmol/L 18(L)  Calcium 8.9 - 10.3 mg/dL 9.0  Total Protein 6.5 - 8.1 g/dL 6.2(L)  Total Bilirubin 0.3 - 1.2 mg/dL 0.4  Alkaline Phos 38 - 126 U/L 162(H)  AST 15 - 41 U/L 20  ALT 0 - 44 U/L 10    Discharge instruction: per After Visit Summary and "Baby and Me Booklet".  After visit meds:  Allergies as of 01/02/2019   No Known Allergies     Medication List    TAKE these medications   ibuprofen 600 MG tablet Commonly known as:  ADVIL,MOTRIN Take 1 tablet (600 mg total) by mouth every 6 (six) hours.   multivitamin-prenatal 27-0.8 MG Tabs tablet Take 1 tablet by mouth daily at 12 noon.       Diet: routine diet  Activity: Advance as tolerated. Pelvic rest for 6 weeks.   Outpatient follow up:BP check in 1 week Follow up Appt:No future appointments. Follow up Visit:No follow-ups on file.  Postpartum contraception: Discussed  Newborn Data: Live born female  Birth Weight: 7 lb 4.4 oz (3300 g) APGAR: 6, 9  Newborn Delivery   Birth date/time:  12/31/2018 20:58:00 Delivery type:  Vaginal, Spontaneous     Baby Feeding: Breast Disposition:home with mother  Circumcision done day of discharge. Cord pH 7.4  01/02/2019 Geryl Rankins, MD

## 2019-01-02 NOTE — Lactation Note (Signed)
This note was copied from a baby's chart. Lactation Consultation Note  Patient Name: Beverly Norris KPQAE'S Date: 01/02/2019 Reason for consult: Follow-up assessment;Difficult latch;Primapara;1st time breastfeeding  P1 mother whose infant is now 24 hours old.    Baby was asleep in bassinet when I arrived.  Mother has been having difficulty latching baby to breast.  She was given a NS but stated that she feels like it makes latching "worse" and she does not care to use it.  She is supplementing with Daron Offer.  I suggested she call me back for latch assistance at the next feeding so I can determine how baby is feeding prior to discharge.  I would like to observe mother doing the latch and give suggestions and advice prior to discharge.  Mother agreeable to this plan and will call me for the next feeding.  Upon assessment her breasts are soft and non tender and nipples are reddened and short shafted.  Mother did state that baby can latch without the NS.  Engorgement prevention/treatment discussed.  Mother has a manual pump and a DEBP for home use.  She has our OP phone number for questions/concerns after discharge.  She will be going to [place] with her mother (support person) when discharged for a couple of weeks until she feels more comfortable caring for baby.  Mother plans to return to her pediatrician here for the next visit and stated that she will drive back from [place].  I will wait for mother to call for my assistance.  Grandmother present.   Maternal Data Formula Feeding for Exclusion: No Has patient been taught Hand Expression?: Yes Does the patient have breastfeeding experience prior to this delivery?: No  Feeding Feeding Type: Formula  LATCH Score                   Interventions    Lactation Tools Discussed/Used     Consult Status Consult Status: Complete Date: 01/02/19 Follow-up type: Call as needed    Beverly Norris Beverly Norris 01/02/2019, 9:16  AM

## 2019-01-02 NOTE — Discharge Instructions (Signed)
Vaginal Delivery, Care After °Refer to this sheet in the next few weeks. These instructions provide you with information about caring for yourself after vaginal delivery. Your health care provider may also give you more specific instructions. Your treatment has been planned according to current medical practices, but problems sometimes occur. Call your health care provider if you have any problems or questions. °What can I expect after the procedure? °After vaginal delivery, it is common to have: °· Some bleeding from your vagina. °· Soreness in your abdomen, your vagina, and the area of skin between your vaginal opening and your anus (perineum). °· Pelvic cramps. °· Fatigue. °Follow these instructions at home: °Medicines °· Take over-the-counter and prescription medicines only as told by your health care provider. °· If you were prescribed an antibiotic medicine, take it as told by your health care provider. Do not stop taking the antibiotic until it is finished. °Driving ° °· Do not drive or operate heavy machinery while taking prescription pain medicine. °· Do not drive for 24 hours if you received a sedative. °Lifestyle °· Do not drink alcohol. This is especially important if you are breastfeeding or taking medicine to relieve pain. °· Do not use tobacco products, including cigarettes, chewing tobacco, or e-cigarettes. If you need help quitting, ask your health care provider. °Eating and drinking °· Drink at least 8 eight-ounce glasses of water every day unless you are told not to by your health care provider. If you choose to breastfeed your baby, you may need to drink more water than this. °· Eat high-fiber foods every day. These foods may help prevent or relieve constipation. High-fiber foods include: °? Whole grain cereals and breads. °? Brown rice. °? Beans. °? Fresh fruits and vegetables. °Activity °· Return to your normal activities as told by your health care provider. Ask your health care provider what  activities are safe for you. °· Rest as much as possible. Try to rest or take a nap when your baby is sleeping. °· Do not lift anything that is heavier than your baby or 10 lb (4.5 kg) until your health care provider says that it is safe. °· Talk with your health care provider about when you can engage in sexual activity. This may depend on your: °? Risk of infection. °? Rate of healing. °? Comfort and desire to engage in sexual activity. °Vaginal Care °· If you have an episiotomy or a vaginal tear, check the area every day for signs of infection. Check for: °? More redness, swelling, or pain. °? More fluid or blood. °? Warmth. °? Pus or a bad smell. °· Do not use tampons or douches until your health care provider says this is safe. °· Watch for any blood clots that may pass from your vagina. These may look like clumps of dark red, brown, or black discharge. °General instructions °· Keep your perineum clean and dry as told by your health care provider. °· Wear loose, comfortable clothing. °· Wipe from front to back when you use the toilet. °· Ask your health care provider if you can shower or take a bath. If you had an episiotomy or a perineal tear during labor and delivery, your health care provider may tell you not to take baths for a certain length of time. °· Wear a bra that supports your breasts and fits you well. °· If possible, have someone help you with household activities and help care for your baby for at least a few days after you   leave the hospital. °· Keep all follow-up visits for you and your baby as told by your health care provider. This is important. °Contact a health care provider if: °· You have: °? Vaginal discharge that has a bad smell. °? Difficulty urinating. °? Pain when urinating. °? A sudden increase or decrease in the frequency of your bowel movements. °? More redness, swelling, or pain around your episiotomy or vaginal tear. °? More fluid or blood coming from your episiotomy or vaginal  tear. °? Pus or a bad smell coming from your episiotomy or vaginal tear. °? A fever. °? A rash. °? Little or no interest in activities you used to enjoy. °? Questions about caring for yourself or your baby. °· Your episiotomy or vaginal tear feels warm to the touch. °· Your episiotomy or vaginal tear is separating or does not appear to be healing. °· Your breasts are painful, hard, or turn red. °· You feel unusually sad or worried. °· You feel nauseous or you vomit. °· You pass large blood clots from your vagina. If you pass a blood clot from your vagina, save it to show to your health care provider. Do not flush blood clots down the toilet without having your health care provider look at them. °· You urinate more than usual. °· You are dizzy or light-headed. °· You have not breastfed at all and you have not had a menstrual period for 12 weeks after delivery. °· You have stopped breastfeeding and you have not had a menstrual period for 12 weeks after you stopped breastfeeding. °Get help right away if: °· You have: °? Pain that does not go away or does not get better with medicine. °? Chest pain. °? Difficulty breathing. °? Blurred vision or spots in your vision. °? Thoughts about hurting yourself or your baby. °· You develop pain in your abdomen or in one of your legs. °· You develop a severe headache. °· You faint. °· You bleed from your vagina so much that you fill two sanitary pads in one hour. °This information is not intended to replace advice given to you by your health care provider. Make sure you discuss any questions you have with your health care provider. °Document Released: 11/11/2000 Document Revised: 04/27/2016 Document Reviewed: 11/29/2015 °Elsevier Interactive Patient Education © 2019 Elsevier Inc. ° °

## 2019-01-03 ENCOUNTER — Ambulatory Visit: Payer: Self-pay

## 2019-01-03 NOTE — Lactation Note (Signed)
This note was copied from a baby's chart. Lactation Consultation Note  Patient Name: Beverly Norris Date: 01/03/2019 Reason for consult: Follow-up assessment;Primapara;1st time breastfeeding;Term  P1 mother whose infant is now 62 hours old.  Mother had no questions/concerns related to breast feeding.  Baby is latching better and mother is feeling more confident about latching.  She has her mother as a good support person and will be going to her house in [place] for a couple weeks after discharge.  Engorgement prevention/treatment reviewed.  Mother has a manual pump and a DEBP for home use.  She has our OP phone number for questions after discharge.  Mother has coconut oil and comfort gels for tender nipples and reviewed use.  Mother is ready for discharge.   Maternal Data Formula Feeding for Exclusion: No Has patient been taught Hand Expression?: Yes Does the patient have breastfeeding experience prior to this delivery?: No  Feeding Feeding Type: Breast Milk  LATCH Score                   Interventions    Lactation Tools Discussed/Used     Consult Status Consult Status: Complete Date: 01/03/19 Follow-up type: Call as needed    Julanne Schlueter R Livian Vanderbeck 01/03/2019, 9:06 AM

## 2019-10-14 ENCOUNTER — Ambulatory Visit: Payer: 59 | Admitting: Family Medicine

## 2019-10-17 ENCOUNTER — Ambulatory Visit (INDEPENDENT_AMBULATORY_CARE_PROVIDER_SITE_OTHER): Payer: 59 | Admitting: Family Medicine

## 2019-10-17 ENCOUNTER — Other Ambulatory Visit: Payer: Self-pay

## 2019-10-17 ENCOUNTER — Encounter: Payer: Self-pay | Admitting: Family Medicine

## 2019-10-17 VITALS — BP 122/82 | HR 62 | Ht 64.0 in | Wt 154.4 lb

## 2019-10-17 DIAGNOSIS — Z7689 Persons encountering health services in other specified circumstances: Secondary | ICD-10-CM

## 2019-10-17 DIAGNOSIS — Z111 Encounter for screening for respiratory tuberculosis: Secondary | ICD-10-CM

## 2019-10-17 DIAGNOSIS — Z0000 Encounter for general adult medical examination without abnormal findings: Secondary | ICD-10-CM | POA: Diagnosis not present

## 2019-10-17 NOTE — Addendum Note (Signed)
Addended by: Girtha Rm on: 10/17/2019 10:06 AM   Modules accepted: Orders, Level of Service

## 2019-10-17 NOTE — Patient Instructions (Signed)
Preventive Care 21-23 Years Old, Female Preventive care refers to visits with your health care provider and lifestyle choices that can promote health and wellness. This includes:  A yearly physical exam. This may also be called an annual well check.  Regular dental visits and eye exams.  Immunizations.  Screening for certain conditions.  Healthy lifestyle choices, such as eating a healthy diet, getting regular exercise, not using drugs or products that contain nicotine and tobacco, and limiting alcohol use. What can I expect for my preventive care visit? Physical exam Your health care provider will check your:  Height and weight. This may be used to calculate body mass index (BMI), which tells if you are at a healthy weight.  Heart rate and blood pressure.  Skin for abnormal spots. Counseling Your health care provider may ask you questions about your:  Alcohol, tobacco, and drug use.  Emotional well-being.  Home and relationship well-being.  Sexual activity.  Eating habits.  Work and work environment.  Method of birth control.  Menstrual cycle.  Pregnancy history. What immunizations do I need?  Influenza (flu) vaccine  This is recommended every year. Tetanus, diphtheria, and pertussis (Tdap) vaccine  You may need a Td booster every 10 years. Varicella (chickenpox) vaccine  You may need this if you have not been vaccinated. Human papillomavirus (HPV) vaccine  If recommended by your health care provider, you may need three doses over 6 months. Measles, mumps, and rubella (MMR) vaccine  You may need at least one dose of MMR. You may also need a second dose. Meningococcal conjugate (MenACWY) vaccine  One dose is recommended if you are age 19-21 years and a first-year college student living in a residence hall, or if you have one of several medical conditions. You may also need additional booster doses. Pneumococcal conjugate (PCV13) vaccine  You may need  this if you have certain conditions and were not previously vaccinated. Pneumococcal polysaccharide (PPSV23) vaccine  You may need one or two doses if you smoke cigarettes or if you have certain conditions. Hepatitis A vaccine  You may need this if you have certain conditions or if you travel or work in places where you may be exposed to hepatitis A. Hepatitis B vaccine  You may need this if you have certain conditions or if you travel or work in places where you may be exposed to hepatitis B. Haemophilus influenzae type b (Hib) vaccine  You may need this if you have certain conditions. You may receive vaccines as individual doses or as more than one vaccine together in one shot (combination vaccines). Talk with your health care provider about the risks and benefits of combination vaccines. What tests do I need?  Blood tests  Lipid and cholesterol levels. These may be checked every 5 years starting at age 20.  Hepatitis C test.  Hepatitis B test. Screening  Diabetes screening. This is done by checking your blood sugar (glucose) after you have not eaten for a while (fasting).  Sexually transmitted disease (STD) testing.  BRCA-related cancer screening. This may be done if you have a family history of breast, ovarian, tubal, or peritoneal cancers.  Pelvic exam and Pap test. This may be done every 3 years starting at age 21. Starting at age 30, this may be done every 5 years if you have a Pap test in combination with an HPV test. Talk with your health care provider about your test results, treatment options, and if necessary, the need for more tests.   Follow these instructions at home: Eating and drinking   Eat a diet that includes fresh fruits and vegetables, whole grains, lean protein, and low-fat dairy.  Take vitamin and mineral supplements as recommended by your health care provider.  Do not drink alcohol if: ? Your health care provider tells you not to drink. ? You are  pregnant, may be pregnant, or are planning to become pregnant.  If you drink alcohol: ? Limit how much you have to 0-1 drink a day. ? Be aware of how much alcohol is in your drink. In the [place] one drink equals one 12 oz bottle of beer (355 mL), one 5 oz glass of wine (148 mL), or one 1 oz glass of hard liquor (44 mL). Lifestyle  Take daily care of your teeth and gums.  Stay active. Exercise for at least 30 minutes on 5 or more days each week.  Do not use any products that contain nicotine or tobacco, such as cigarettes, e-cigarettes, and chewing tobacco. If you need help quitting, ask your health care provider.  If you are sexually active, practice safe sex. Use a condom or other form of birth control (contraception) in order to prevent pregnancy and STIs (sexually transmitted infections). If you plan to become pregnant, see your health care provider for a preconception visit. What's next?  Visit your health care provider once a year for a well check visit.  Ask your health care provider how often you should have your eyes and teeth checked.  Stay up to date on all vaccines. This information is not intended to replace advice given to you by your health care provider. Make sure you discuss any questions you have with your health care provider. Document Released: 01/10/2002 Document Revised: 07/26/2018 Document Reviewed: 07/26/2018 Elsevier Patient Education  2020 Elsevier Inc.  

## 2019-10-17 NOTE — Progress Notes (Addendum)
Subjective:    Patient ID: Beverly Norris, female    DOB: 06-30-1996, 23 y.o.   MRN: [id number]  HPI Chief Complaint  Patient presents with  . new pt    new pt, get established. no other concerns. form for school   She is new to the practice and here for a CPE. Previous medical care: moved here from [facility] approximately 1 year ago. Recent medical care has been with her OB/GYN.  She had a vaginal birth 9 months ago.  Her OB/GYN is with [facility]. States she has not had a PCP in years.  She did have a wellness exam [facility] last month.  She was screened for diabetes and her hemoglobin A1c was normal.  Her lipid panel was also normal.  She received a flu vaccination.  She has forms with her today to fill out for [facility] that require a physical examination.  Discussed that her visit today is scheduled for get established visit and not for a CPE.  I also reviewed the notes from the [facility] which labeled her visit as a wellness visit.  Other providers: [facility]- Dr. Gunnar Bulla    Lives with her boyfriend and has a 15 month old.  She is not breast-feeding Has IUD.   Vaginal birth.  2 pregnancies with 1 abortion.    Education: [employer]. BS in Biology  Plans to attend [employer] nursing.   Social history: Lives with boyfriend and 31 month old, works for [employer]  Denies smoking and drug use.  Alcohol use is social Diet: fairly healthy  Excerise: nothing regular    Wears seatbelt always, smoke detectors in home and functioning, does not text while driving and feels safe in home environment.   Reviewed allergies, medications, past medical, surgical, family, and social history.    Review of Systems Review of Systems Constitutional: -fever, -chills, -sweats, -unexpected weight change,-fatigue ENT: -runny nose, -ear pain, -sore throat Cardiology:  -chest pain, -palpitations, -edema Respiratory: -cough, -shortness  of breath, -wheezing Gastroenterology: -abdominal pain, -nausea, -vomiting, -diarrhea, -constipation  Hematology: -bleeding or bruising problems Musculoskeletal: -arthralgias, -myalgias, -joint swelling, -back pain Ophthalmology: -vision changes Urology: -dysuria, -difficulty urinating, -hematuria, -urinary frequency, -urgency Neurology: -headache, -weakness, -tingling, -numbness        Objective:   Physical Exam BP 122/82   Pulse 62   Ht 5\' 4"  (1.626 m)   Wt 154 lb 6.4 oz (70 kg)   LMP 10/13/2019   Breastfeeding No   BMI 26.50 kg/m   General Appearance:    Alert, cooperative, no distress, appears stated age  Head:    Normocephalic, without obvious abnormality, atraumatic  Eyes:    PERRL, conjunctiva/corneas clear, EOM's intact  Ears:    Normal TM's and external ear canals  Nose:   Mask in place   Throat:   Mask in place   Neck:   Supple, no lymphadenopathy;  thyroid:  no   enlargement/tenderness/nodules; no carotid   bruit or JVD  Back:    Spine nontender, no curvature, ROM normal, no CVA     tenderness  Lungs:     Clear to auscultation bilaterally without wheezes, rales or     ronchi; respirations unlabored  Chest Wall:    No tenderness or deformity   Heart:    Regular rate and rhythm, S1 and S2 normal, no murmur, rub   or gallop  Breast Exam:    OB/GYN  Abdomen:  Soft, non-tender, nondistended, normoactive bowel sounds,    no masses, no hepatosplenomegaly  Genitalia:    OB/GYN  Rectal:    Not performed due to age<40 and no related complaints  Extremities:   No clubbing, cyanosis or edema  Pulses:   2+ and symmetric all extremities  Skin:   Skin color, texture, turgor normal, no rashes or lesions  Lymph nodes:   Cervical, supraclavicular, and axillary nodes normal  Neurologic:   CNII-XII intact, normal strength, sensation and gait; reflexes 2+ and symmetric throughout          Psych:   Normal mood, affect, hygiene and grooming.        Assessment & Plan:  Routine  general medical examination at a health care facility - Plan: CBC with Differential, Comprehensive metabolic panel  Encounter to establish care  Tuberculosis screening - Plan: QuantiFERON-TB Gold Plus  She is new to the practice and here for a CPE. She has forms that I need to fill out.  She is in her usual state of health today.   Preventive health care discussed. She has an OB/GYN. Immunizations reviewed. Counseling on healthy lifestyle with diet and exercise. Discussed safety.  Follow up pending labs.

## 2019-10-18 LAB — COMPREHENSIVE METABOLIC PANEL
ALT: 12 IU/L (ref 0–32)
AST: 18 IU/L (ref 0–40)
Albumin/Globulin Ratio: 1.9 (ref 1.2–2.2)
Albumin: 4.7 g/dL (ref 3.9–5.0)
Alkaline Phosphatase: 92 IU/L (ref 39–117)
BUN/Creatinine Ratio: 19 (ref 9–23)
BUN: 15 mg/dL (ref 6–20)
Bilirubin Total: 0.6 mg/dL (ref 0.0–1.2)
CO2: 21 mmol/L (ref 20–29)
Calcium: 9.8 mg/dL (ref 8.7–10.2)
Chloride: 100 mmol/L (ref 96–106)
Creatinine, Ser: 0.77 mg/dL (ref 0.57–1.00)
GFR calc Af Amer: 126 mL/min/{1.73_m2} (ref >59)
GFR calc non Af Amer: 109 mL/min/{1.73_m2} (ref >59)
Globulin, Total: 2.5 g/dL (ref 1.5–4.5)
Glucose: 92 mg/dL (ref 65–99)
Potassium: 5.2 mmol/L (ref 3.5–5.2)
Sodium: 141 mmol/L (ref 134–144)
Total Protein: 7.2 g/dL (ref 6.0–8.5)

## 2019-10-18 LAB — CBC WITH DIFFERENTIAL/PLATELET
Basophils Absolute: 0 10*3/uL (ref 0.0–0.2)
Basos: 1 %
EOS (ABSOLUTE): 0.1 10*3/uL (ref 0.0–0.4)
Eos: 2 %
Hematocrit: 42.3 % (ref 34.0–46.6)
Hemoglobin: 14.7 g/dL (ref 11.1–15.9)
Immature Grans (Abs): 0 10*3/uL (ref 0.0–0.1)
Immature Granulocytes: 0 %
Lymphocytes Absolute: 2 10*3/uL (ref 0.7–3.1)
Lymphs: 27 %
MCH: 31.2 pg (ref 26.6–33.0)
MCHC: 34.8 g/dL (ref 31.5–35.7)
MCV: 90 fL (ref 79–97)
Monocytes Absolute: 0.4 10*3/uL (ref 0.1–0.9)
Monocytes: 5 %
Neutrophils Absolute: 4.8 10*3/uL (ref 1.4–7.0)
Neutrophils: 65 %
Platelets: 367 10*3/uL (ref 150–450)
RBC: 4.71 x10E6/uL (ref 3.77–5.28)
RDW: 11.6 % — ABNORMAL LOW (ref 11.7–15.4)
WBC: 7.3 10*3/uL (ref 3.4–10.8)

## 2019-10-20 LAB — QUANTIFERON-TB GOLD PLUS
QuantiFERON Mitogen Value: >10 IU/mL
QuantiFERON Nil Value: 0.02 IU/mL
QuantiFERON TB1 Ag Value: 0.02 IU/mL
QuantiFERON TB2 Ag Value: 0.02 IU/mL
QuantiFERON-TB Gold Plus: NEGATIVE

## 2020-03-02 DIAGNOSIS — Z124 Encounter for screening for malignant neoplasm of cervix: Secondary | ICD-10-CM | POA: Diagnosis not present

## 2020-03-02 DIAGNOSIS — Z01419 Encounter for gynecological examination (general) (routine) without abnormal findings: Secondary | ICD-10-CM | POA: Diagnosis not present

## 2020-03-11 ENCOUNTER — Other Ambulatory Visit: Payer: 59

## 2020-03-11 DIAGNOSIS — Z01419 Encounter for gynecological examination (general) (routine) without abnormal findings: Secondary | ICD-10-CM | POA: Diagnosis not present

## 2020-03-11 LAB — OB RESULTS CONSOLE GC/CHLAMYDIA: Chlamydia: NEGATIVE

## 2020-03-11 LAB — HM PAP SMEAR: HM Pap smear: NEGATIVE

## 2020-05-04 DIAGNOSIS — R4586 Emotional lability: Secondary | ICD-10-CM | POA: Diagnosis not present

## 2020-05-28 DIAGNOSIS — Z419 Encounter for procedure for purposes other than remedying health state, unspecified: Secondary | ICD-10-CM | POA: Diagnosis not present

## 2020-06-28 DIAGNOSIS — Z419 Encounter for procedure for purposes other than remedying health state, unspecified: Secondary | ICD-10-CM | POA: Diagnosis not present

## 2020-07-11 DIAGNOSIS — Z03818 Encounter for observation for suspected exposure to other biological agents ruled out: Secondary | ICD-10-CM | POA: Diagnosis not present

## 2020-07-11 DIAGNOSIS — R070 Pain in throat: Secondary | ICD-10-CM | POA: Diagnosis not present

## 2020-07-11 DIAGNOSIS — Z20822 Contact with and (suspected) exposure to covid-19: Secondary | ICD-10-CM | POA: Diagnosis not present

## 2020-07-17 DIAGNOSIS — U071 COVID-19: Secondary | ICD-10-CM | POA: Diagnosis not present

## 2020-07-17 DIAGNOSIS — Z20822 Contact with and (suspected) exposure to covid-19: Secondary | ICD-10-CM | POA: Diagnosis not present

## 2020-07-17 HISTORY — DX: COVID-19: U07.1

## 2020-07-24 DIAGNOSIS — Z03818 Encounter for observation for suspected exposure to other biological agents ruled out: Secondary | ICD-10-CM | POA: Diagnosis not present

## 2020-07-24 DIAGNOSIS — Z20822 Contact with and (suspected) exposure to covid-19: Secondary | ICD-10-CM | POA: Diagnosis not present

## 2020-08-28 DIAGNOSIS — Z419 Encounter for procedure for purposes other than remedying health state, unspecified: Secondary | ICD-10-CM | POA: Diagnosis not present

## 2020-09-21 DIAGNOSIS — Z23 Encounter for immunization: Secondary | ICD-10-CM | POA: Diagnosis not present

## 2020-09-28 DIAGNOSIS — Z419 Encounter for procedure for purposes other than remedying health state, unspecified: Secondary | ICD-10-CM | POA: Diagnosis not present

## 2020-10-28 DIAGNOSIS — Z419 Encounter for procedure for purposes other than remedying health state, unspecified: Secondary | ICD-10-CM | POA: Diagnosis not present

## 2020-11-17 DIAGNOSIS — F4323 Adjustment disorder with mixed anxiety and depressed mood: Secondary | ICD-10-CM | POA: Diagnosis not present

## 2020-11-23 DIAGNOSIS — F4323 Adjustment disorder with mixed anxiety and depressed mood: Secondary | ICD-10-CM | POA: Diagnosis not present

## 2020-11-28 DIAGNOSIS — Z419 Encounter for procedure for purposes other than remedying health state, unspecified: Secondary | ICD-10-CM | POA: Diagnosis not present

## 2020-12-03 DIAGNOSIS — F4323 Adjustment disorder with mixed anxiety and depressed mood: Secondary | ICD-10-CM | POA: Diagnosis not present

## 2020-12-16 DIAGNOSIS — F4323 Adjustment disorder with mixed anxiety and depressed mood: Secondary | ICD-10-CM | POA: Diagnosis not present

## 2020-12-29 DIAGNOSIS — Z419 Encounter for procedure for purposes other than remedying health state, unspecified: Secondary | ICD-10-CM | POA: Diagnosis not present

## 2020-12-30 DIAGNOSIS — F4323 Adjustment disorder with mixed anxiety and depressed mood: Secondary | ICD-10-CM | POA: Diagnosis not present

## 2021-01-26 DIAGNOSIS — Z419 Encounter for procedure for purposes other than remedying health state, unspecified: Secondary | ICD-10-CM | POA: Diagnosis not present

## 2021-02-02 DIAGNOSIS — F4323 Adjustment disorder with mixed anxiety and depressed mood: Secondary | ICD-10-CM | POA: Diagnosis not present

## 2021-02-12 ENCOUNTER — Ambulatory Visit (INDEPENDENT_AMBULATORY_CARE_PROVIDER_SITE_OTHER): Payer: Federal, State, Local not specified - PPO | Admitting: Family Medicine

## 2021-02-12 ENCOUNTER — Other Ambulatory Visit: Payer: Self-pay

## 2021-02-12 ENCOUNTER — Encounter: Payer: Self-pay | Admitting: Family Medicine

## 2021-02-12 VITALS — BP 110/60 | HR 85 | Ht 64.5 in | Wt 160.2 lb

## 2021-02-12 DIAGNOSIS — Z1329 Encounter for screening for other suspected endocrine disorder: Secondary | ICD-10-CM

## 2021-02-12 DIAGNOSIS — Z0000 Encounter for general adult medical examination without abnormal findings: Secondary | ICD-10-CM

## 2021-02-12 DIAGNOSIS — Z87440 Personal history of urinary (tract) infections: Secondary | ICD-10-CM | POA: Insufficient documentation

## 2021-02-12 DIAGNOSIS — M546 Pain in thoracic spine: Secondary | ICD-10-CM

## 2021-02-12 DIAGNOSIS — R32 Unspecified urinary incontinence: Secondary | ICD-10-CM | POA: Diagnosis not present

## 2021-02-12 DIAGNOSIS — R319 Hematuria, unspecified: Secondary | ICD-10-CM | POA: Diagnosis not present

## 2021-02-12 DIAGNOSIS — G8929 Other chronic pain: Secondary | ICD-10-CM | POA: Insufficient documentation

## 2021-02-12 DIAGNOSIS — R519 Headache, unspecified: Secondary | ICD-10-CM

## 2021-02-12 HISTORY — DX: Personal history of urinary (tract) infections: Z87.440

## 2021-02-12 LAB — POCT URINALYSIS DIP (PROADVANTAGE DEVICE)
Bilirubin, UA: NEGATIVE
Glucose, UA: NEGATIVE mg/dL
Ketones, POC UA: NEGATIVE mg/dL
Leukocytes, UA: NEGATIVE
Nitrite, UA: NEGATIVE
Specific Gravity, Urine: 1.03
Urobilinogen, Ur: NEGATIVE
pH, UA: 6 (ref 5.0–8.0)

## 2021-02-12 LAB — CBC WITH DIFFERENTIAL/PLATELET: Neutrophils Absolute: 5 10*3/uL (ref 1.4–7.0)

## 2021-02-12 LAB — TSH

## 2021-02-12 LAB — COMPREHENSIVE METABOLIC PANEL

## 2021-02-12 MED ORDER — SULFAMETHOXAZOLE-TRIMETHOPRIM 800-160 MG PO TABS: 1.0000 | ORAL_TABLET | Freq: Two times a day (BID) | ORAL | 0 refills | Status: DC

## 2021-02-12 NOTE — Progress Notes (Signed)
Subjective:    Patient ID: Beverly Norris, female    DOB: Mar 20, 1996, 25 y.o.   MRN: [id number]  HPI Chief Complaint  Patient presents with  . physical    Nonfasting cpe,  sees obgyn- [facility], has migraines possible referral to neuro   She is new to the practice and here for a complete physical exam.  Other providers: [facility]   Reports having chronic headaches since 7th grade. Usually her headaches are bilateral temporal and pressure.  Other times she has unilateral throbbing sensations.  Photosensitivity, phonophobia and N/V.  Triggers are stress and lack of sleep.   States when she was pregnant she was having more headaches.   Neck tightness usually prior to her headaches within the 30 minutes.   She she had 2 over the past month. States if she takes medication as soon as symptoms arise then it helps.  Last headache was last week.   Takes excedrin and sleep to relieve pain.   States she had an episode of urinary incontinence a couple of weeks ago. States she had a serious UTI a few years ago.  She is also had a history of hematuria.  At the end of the visit she mentioned that she was having some thoracic back pain bilaterally but worse on the left.  The pain is intermittent.  She is also noticed some intermittent numbness that is occurring 4-5 times per day and lasting approximately 5 minutes each time.  Denies have any symptoms today.  She has been wearing tight sports bra and feels that her breasts have been enlarged recently.  No numbness, tingling or weakness in her extremities.  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 Drinks alcohol twice weekly.  Diet: fairly healthy  Excerise: started regularly   Immunizations: Gardasil in the past but not sure how many she had  Covid booster received per patient.   Health maintenance:  Mammogram: N/A Colonoscopy: N/A Last  Gynecological Exam: April 2021  Last Menstrual cycle: Mirena and off and on since  Last Dental Exam: over a year  Last Eye Exam: 3 years ago   Wears seatbelt always, uses sunscreen, smoke detectors in home and functioning, does not text while driving and feels safe in home environment.   Reviewed allergies, medications, past medical, surgical, family, and social history.   Review of Systems Review of Systems Constitutional: -fever, -chills, -sweats, -unexpected weight change,-fatigue ENT: -runny nose, -ear pain, -sore throat Cardiology:  -chest pain, -palpitations, -edema Respiratory: -cough, -shortness of breath, -wheezing Gastroenterology: -abdominal pain, -nausea, -vomiting, -diarrhea, -constipation  Hematology: -bleeding or bruising problems Musculoskeletal: -arthralgias, -myalgias, -joint swelling, -back pain Ophthalmology: -vision changes Urology: -dysuria, -difficulty urinating, -hematuria, -urinary frequency, -urgency Neurology: -headache, -weakness, -tingling, -numbness       Objective:   Physical Exam BP 110/60   Pulse 85   Ht 5' 4.5" (1.638 m)   Wt 160 lb 3.2 oz (72.7 kg)   BMI 27.07 kg/m   General Appearance:    Alert, cooperative, no distress, appears stated age  Head:    Normocephalic, without obvious abnormality, atraumatic  Eyes:    PERRL, conjunctiva/corneas clear, EOM's intact  Ears:    Normal TM's and external ear canals  Nose:   Mask on   Throat:   Mask on   Neck:   Supple, no lymphadenopathy;  thyroid:  no   enlargement/tenderness/nodules; no JVD  Back:  Spine nontender, no curvature, ROM normal, no CVA     tenderness  Lungs:     Clear to auscultation bilaterally without wheezes, rales or     ronchi; respirations unlabored  Chest Wall:    No tenderness or deformity   Heart:    Regular rate and rhythm, S1 and S2 normal, no murmur, rub   or gallop  Breast Exam:    OB/GYN  Abdomen:     Soft, non-tender, nondistended, normoactive bowel sounds,    no  masses, no hepatosplenomegaly  Genitalia:    OB/gYN  Rectal:    Not performed due to age<40 and no related complaints  Extremities:   No clubbing, cyanosis or edema  Pulses:   2+ and symmetric all extremities  Skin:   Skin color, texture, turgor normal, no rashes or lesions  Lymph nodes:   Cervical, supraclavicular, and axillary nodes normal  Neurologic:   CNII-XII intact, normal strength, sensation and gait; reflexes 2+ and symmetric throughout          Psych:   Normal mood, affect, hygiene and grooming.         Assessment & Plan:  Routine general medical examination at a health care facility - Plan: CBC with Differential/Platelet, Comprehensive metabolic panel, TSH, T4, free -This is her second visit with her first 1 being a CPE in November 2020. Preventive health care reviewed.  She sees her OB/GYN.  Counseling on healthy lifestyle: Diet and exercise.  Recommend regular dental and eye exams.  Immunizations reviewed.  Recommend Gardasil vaccine.  Discussed safety and health promotion.  Recommend specifically that she use good sunscreen due to her skin tone.  History of UTI - Plan: POCT Urinalysis DIP (Proadvantage Device) -Urinalysis dipstick shows nit, 2+blood,   Chronic nonintractable headache, unspecified headache type -last headache sometime last week and 2times per month. Counseling on getting adequate sleep, hydration and avoiding fluctuations in caffeine, skipping meals, etc.  Continue using Excedrin as needed.  If she starts having more frequent headaches or new symptoms then we will consider referral to neurology.  Follow-up in 4 to 8 weeks.  Chronic left-sided thoracic back pain - Plan: DG Thoracic Spine W/Swimmers -Unclear etiology. Normal exam.  This may be due to tight sports bra. We will get thoracic x-ray and consider referral to PT.  If the numbness persists, we will consider referral to neurology.  Follow-up in 4 to 8 weeks for this or sooner as needed  Screening for  thyroid disorder - Plan: TSH, T4, free  Urinary incontinence, unspecified type - Plan: POCT Urinalysis DIP (Proadvantage Device) -Urinalysis dipstick  2 + blood, nit She is aware that if this occurs again that she should follow-up.

## 2021-02-12 NOTE — Patient Instructions (Addendum)
Keep a headache diary over the next 4 to 8 weeks.  Look for triggers and any new or worsening symptoms.  Keep up with the associated symptoms. Avoid fluctuations in caffeine.  Stay well-hydrated.  Try to get regular sleep.  Eat small frequent meals and do not skip meals. Continue taking a multivitamin.  Have Excedrin on hand to take at the first sign of symptoms.  Follow-up if worsening.  Go to [facility] to get the thoracic spine x-ray. Let me know if you are having any new or worsening symptoms.    Preventive Care 60-75 Years Old, Female Preventive care refers to lifestyle choices and visits with your health care provider that can promote health and wellness. This includes:  A yearly physical exam. This is also called an annual wellness visit.  Regular dental and eye exams.  Immunizations.  Screening for certain conditions.  Healthy lifestyle choices, such as: ? Eating a healthy diet. ? Getting regular exercise. ? Not using drugs or products that contain nicotine and tobacco. ? Limiting alcohol use. What can I expect for my preventive care visit? Physical exam Your health care provider may check your:  Height and weight. These may be used to calculate your BMI (body mass index). BMI is a measurement that tells if you are at a healthy weight.  Heart rate and blood pressure.  Body temperature.  Skin for abnormal spots. Counseling Your health care provider may ask you questions about your:  Past medical problems.  Family's medical history.  Alcohol, tobacco, and drug use.  Emotional well-being.  Home life and relationship well-being.  Sexual activity.  Diet, exercise, and sleep habits.  Work and work Statistician.  Access to firearms.  Method of birth control.  Menstrual cycle.  Pregnancy history. What immunizations do I need? Vaccines are usually given at various ages, according to a schedule. Your health care provider will recommend vaccines  for you based on your age, medical history, and lifestyle or other factors, such as travel or where you work.   What tests do I need? Blood tests  Lipid and cholesterol levels. These may be checked every 5 years starting at age 25.  Hepatitis C test.  Hepatitis B test. Screening  Diabetes screening. This is done by checking your blood sugar (glucose) after you have not eaten for a while (fasting).  STD (sexually transmitted disease) testing, if you are at risk.  BRCA-related cancer screening. This may be done if you have a family history of breast, ovarian, tubal, or peritoneal cancers.  Pelvic exam and Pap test. This may be done every 3 years starting at age 23. Starting at age 23, this may be done every 5 years if you have a Pap test in combination with an HPV test. Talk with your health care provider about your test results, treatment options, and if necessary, the need for more tests.   Follow these instructions at home: Eating and drinking  Eat a healthy diet that includes fresh fruits and vegetables, whole grains, lean protein, and low-fat dairy products.  Take vitamin and mineral supplements as recommended by your health care provider.  Do not drink alcohol if: ? Your health care provider tells you not to drink. ? You are pregnant, may be pregnant, or are planning to become pregnant.  If you drink alcohol: ? Limit how much you have to 0-1 drink a day. ? Be aware of how much alcohol is in your drink. In the [place] one drink equals one  12 oz bottle of beer (355 mL), one 5 oz glass of wine (148 mL), or one 1 oz glass of hard liquor (44 mL).   Lifestyle  Take daily care of your teeth and gums. Brush your teeth every morning and night with fluoride toothpaste. Floss one time each day.  Stay active. Exercise for at least 30 minutes 5 or more days each week.  Do not use any products that contain nicotine or tobacco, such as cigarettes, e-cigarettes, and chewing tobacco. If you  need help quitting, ask your health care provider.  Do not use drugs.  If you are sexually active, practice safe sex. Use a condom or other form of protection to prevent STIs (sexually transmitted infections).  If you do not wish to become pregnant, use a form of birth control. If you plan to become pregnant, see your health care provider for a prepregnancy visit.  Find healthy ways to cope with stress, such as: ? Meditation, yoga, or listening to music. ? Journaling. ? Talking to a trusted person. ? Spending time with friends and family. Safety  Always wear your seat belt while driving or riding in a vehicle.  Do not drive: ? If you have been drinking alcohol. Do not ride with someone who has been drinking. ? When you are tired or distracted. ? While texting.  Wear a helmet and other protective equipment during sports activities.  If you have firearms in your house, make sure you follow all gun safety procedures.  Seek help if you have been physically or sexually abused. What's next?  Go to your health care provider once a year for an annual wellness visit.  Ask your health care provider how often you should have your eyes and teeth checked.  Stay up to date on all vaccines. This information is not intended to replace advice given to you by your health care provider. Make sure you discuss any questions you have with your health care provider. Document Revised: 07/12/2020 Document Reviewed: 07/26/2018 Elsevier Patient Education  2021 [employer].

## 2021-02-13 LAB — CBC WITH DIFFERENTIAL/PLATELET
Basophils Absolute: 0.1 10*3/uL (ref 0.0–0.2)
Basos: 1 %
EOS (ABSOLUTE): 0.3 10*3/uL (ref 0.0–0.4)
Eos: 3 %
Hematocrit: 39.4 % (ref 34.0–46.6)
Hemoglobin: 13.3 g/dL (ref 11.1–15.9)
Immature Grans (Abs): 0 10*3/uL (ref 0.0–0.1)
Immature Granulocytes: 0 %
Lymphocytes Absolute: 2 10*3/uL (ref 0.7–3.1)
Lymphs: 25 %
MCH: 30.7 pg (ref 26.6–33.0)
MCHC: 33.8 g/dL (ref 31.5–35.7)
MCV: 91 fL (ref 79–97)
Monocytes Absolute: 0.7 10*3/uL (ref 0.1–0.9)
Monocytes: 9 %
Neutrophils: 62 %
Platelets: 309 10*3/uL (ref 150–450)
RBC: 4.33 x10E6/uL (ref 3.77–5.28)
RDW: 11.7 % (ref 11.7–15.4)
WBC: 8.1 10*3/uL (ref 3.4–10.8)

## 2021-02-13 LAB — URINALYSIS, MICROSCOPIC ONLY
Casts: NONE SEEN /lpf
Epithelial Cells (non renal): >10 /hpf — AB (ref 0–10)

## 2021-02-13 LAB — COMPREHENSIVE METABOLIC PANEL
ALT: 20 IU/L (ref 0–32)
AST: 51 IU/L — ABNORMAL HIGH (ref 0–40)
Albumin/Globulin Ratio: 1.8 (ref 1.2–2.2)
Albumin: 4.4 g/dL (ref 3.9–5.0)
Alkaline Phosphatase: 57 IU/L (ref 44–121)
BUN/Creatinine Ratio: 14 (ref 9–23)
BUN: 11 mg/dL (ref 6–20)
Bilirubin Total: 0.4 mg/dL (ref 0.0–1.2)
CO2: 20 mmol/L (ref 20–29)
Calcium: 9.4 mg/dL (ref 8.7–10.2)
Creatinine, Ser: 0.76 mg/dL (ref 0.57–1.00)
Glucose: 93 mg/dL (ref 65–99)
Potassium: 5.1 mmol/L (ref 3.5–5.2)
Sodium: 144 mmol/L (ref 134–144)
Total Protein: 6.8 g/dL (ref 6.0–8.5)
eGFR: 112 mL/min/{1.73_m2} (ref >59)

## 2021-02-13 LAB — T4, FREE: Free T4: 1.09 ng/dL (ref 0.82–1.77)

## 2021-02-15 DIAGNOSIS — F411 Generalized anxiety disorder: Secondary | ICD-10-CM | POA: Diagnosis not present

## 2021-02-16 LAB — URINE CULTURE

## 2021-02-16 NOTE — Progress Notes (Signed)
Please let her know that her labs overall are fine but her AST which is one of her liver enzymes is mildly elevated.  This could be due to recent alcohol use.  I recommend stopping alcohol for now and we will recheck this at her follow-up visit. Her microscopic urinalysis did not show blood which is good.  The antibiotic I prescribed should take care of her urinary symptoms.  If not we will address this at her follow-up also.

## 2021-02-26 DIAGNOSIS — Z419 Encounter for procedure for purposes other than remedying health state, unspecified: Secondary | ICD-10-CM | POA: Diagnosis not present

## 2021-03-01 ENCOUNTER — Encounter: Payer: Self-pay | Admitting: Family Medicine

## 2021-03-01 DIAGNOSIS — F411 Generalized anxiety disorder: Secondary | ICD-10-CM | POA: Diagnosis not present

## 2021-03-08 ENCOUNTER — Ambulatory Visit
Admission: RE | Admit: 2021-03-08 | Discharge: 2021-03-08 | Disposition: A | Discharge status: Home / Self Care | Payer: Federal, State, Local not specified - PPO | Source: Ambulatory Visit | Attending: Family Medicine | Admitting: Family Medicine

## 2021-03-08 ENCOUNTER — Other Ambulatory Visit: Payer: Self-pay

## 2021-03-08 DIAGNOSIS — M546 Pain in thoracic spine: Secondary | ICD-10-CM | POA: Diagnosis not present

## 2021-03-08 DIAGNOSIS — G8929 Other chronic pain: Secondary | ICD-10-CM

## 2021-03-09 ENCOUNTER — Other Ambulatory Visit (INDEPENDENT_AMBULATORY_CARE_PROVIDER_SITE_OTHER): Payer: Federal, State, Local not specified - PPO

## 2021-03-09 ENCOUNTER — Telehealth (INDEPENDENT_AMBULATORY_CARE_PROVIDER_SITE_OTHER): Payer: Federal, State, Local not specified - PPO | Admitting: Family Medicine

## 2021-03-09 DIAGNOSIS — R509 Fever, unspecified: Secondary | ICD-10-CM

## 2021-03-09 DIAGNOSIS — R630 Anorexia: Secondary | ICD-10-CM

## 2021-03-09 DIAGNOSIS — G43009 Migraine without aura, not intractable, without status migrainosus: Secondary | ICD-10-CM | POA: Diagnosis not present

## 2021-03-09 LAB — POC COVID19 BINAXNOW: SARS Coronavirus 2 Ag: NEGATIVE

## 2021-03-09 LAB — POCT INFLUENZA A/B
Influenza A, POC: NEGATIVE
Influenza B, POC: NEGATIVE

## 2021-03-09 NOTE — Progress Notes (Signed)
Her XR does not show anything to explain her symptoms. Would she like to try PT or a referral to orthopedist?

## 2021-03-09 NOTE — Progress Notes (Signed)
   Subjective:    Patient ID: Beverly Norris, female    DOB: 13-Feb-1996, 25 y.o.   MRN: [id number]  HPI Documentation for virtual audio and video telecommunications through Caregility encounter: The patient was located at home. 2 patient identifiers used.  The provider was located in the office. The patient did consent to this visit and is aware of possible charges through their insurance for this visit. The other persons participating in this telemedicine service were none. Time spent on call was 5 minutes and in review of previous records >20 minutes total for counseling and coordination of care. This virtual service is not related to other E/M service within previous 7 days. She states that last Thursday she developed some nausea with some vomiting on occasion as well as unilateral throbbing headache with photophobia.  She treated it with Excedrin and states that the that headache is gone but she is still having a basic headache.  Yesterday she noted the onset of fever that she says was up to 103 with fatigue continued anorexia but no nasal congestion, coughing, diarrhea.  Normal smell and taste.  She has had the Covid vaccine x3.  Review of Systems     Objective:   Physical Exam Alert and in no distress but slightly fatigued looking.       Assessment & Plan:  Migraine without aura and without status migrainosus, not intractable  Fever, unspecified fever cause - Plan: POC COVID-19, Novel Coronavirus, NAA ([facility]), Influenza A/B  Anorexia - Plan: POC COVID-19, Novel Coronavirus, NAA ([facility]), Influenza A/B I explained that I did not think it was Covid but I think is reasonable to try and prove that.  Recommend Tylenol and 2 Aleve twice per day to help with fever headaches.  Also discussed using 2 Aleve at the beginning of the migraine to help mitigate that as well.

## 2021-03-10 LAB — NOVEL CORONAVIRUS, NAA: SARS-CoV-2, NAA: NOT DETECTED

## 2021-03-10 LAB — SARS-COV-2, NAA 2 DAY TAT

## 2021-03-15 ENCOUNTER — Telehealth: Payer: Self-pay | Admitting: Internal Medicine

## 2021-03-15 NOTE — Telephone Encounter (Signed)
Pt called asking for her vaccination record. Left message for pt to come back

## 2021-03-23 ENCOUNTER — Ambulatory Visit: Payer: Federal, State, Local not specified - PPO | Admitting: Family Medicine

## 2021-03-28 DIAGNOSIS — Z419 Encounter for procedure for purposes other than remedying health state, unspecified: Secondary | ICD-10-CM | POA: Diagnosis not present

## 2021-03-31 ENCOUNTER — Encounter: Payer: Self-pay | Admitting: Family Medicine

## 2021-03-31 ENCOUNTER — Ambulatory Visit (INDEPENDENT_AMBULATORY_CARE_PROVIDER_SITE_OTHER): Payer: Federal, State, Local not specified - PPO | Admitting: Family Medicine

## 2021-03-31 ENCOUNTER — Other Ambulatory Visit: Payer: Self-pay

## 2021-03-31 VITALS — BP 120/72 | HR 67 | Wt 151.0 lb

## 2021-03-31 DIAGNOSIS — Z01419 Encounter for gynecological examination (general) (routine) without abnormal findings: Secondary | ICD-10-CM | POA: Diagnosis not present

## 2021-03-31 DIAGNOSIS — R7401 Elevation of levels of liver transaminase levels: Secondary | ICD-10-CM | POA: Diagnosis not present

## 2021-03-31 DIAGNOSIS — R3915 Urgency of urination: Secondary | ICD-10-CM | POA: Diagnosis not present

## 2021-03-31 DIAGNOSIS — N941 Unspecified dyspareunia: Secondary | ICD-10-CM | POA: Diagnosis not present

## 2021-03-31 DIAGNOSIS — R201 Hypoesthesia of skin: Secondary | ICD-10-CM | POA: Diagnosis not present

## 2021-03-31 DIAGNOSIS — R200 Anesthesia of skin: Secondary | ICD-10-CM | POA: Diagnosis not present

## 2021-03-31 DIAGNOSIS — Z30431 Encounter for routine checking of intrauterine contraceptive device: Secondary | ICD-10-CM | POA: Diagnosis not present

## 2021-03-31 NOTE — Progress Notes (Signed)
   Subjective:    Patient ID: Beverly Norris, female    DOB: 05-Nov-1996, 25 y.o.   MRN: [id number]  HPI Chief Complaint  Patient presents with  . follow-up    Follow-up on headaches and back pain. No headaches recently but back pain is constant numb   This is her 2nd visit with me. She is here for a follow up on migraine headaches and numbness of her back. She is still having intermittent numbness of her left upper back. This has been ongoing for the past 7 months approximately. Denies pain.  Numbness occurs with certain movements such as leaning over bathing her child.   Does not currently having concerns regarding headaches.   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   States she is seeing her OB/GYN today for her pap smear.   UTI symptoms resolved.   Mildly elevated AST previously.  She denies any alcohol use.  Denies fever, chills, dizziness, chest pain, palpitations, shortness of breath, abdominal pain, N/V/D, urinary symptoms.     Review of Systems Pertinent positives and negatives in the history of present illness.     Objective:   Physical Exam BP 120/72   Pulse 67   Wt 151 lb (68.5 kg)   SpO2 98%   BMI 25.52 kg/m   Alert and oriented and in no acute distress.  Respirations unlabored.  Normal appearance, sensation, range of motion of her back.      Assessment & Plan:  Hypoesthesia of skin - Plan: Comprehensive metabolic panel, Vitamin B12, Ambulatory referral to Neurology  Numbness  Elevated AST (SGOT) - Plan: Comprehensive metabolic panel  Increasingly worsening intermittent numbness of her left thoracic region without any obvious explanation.  We have checked labs as well as an x-ray of her thoracic spine. She is asymptomatic today with a normal exam. Referral to neurology for further assistance Recheck AST.  Denies any alcohol use.  Follow-up pending results

## 2021-04-01 LAB — COMPREHENSIVE METABOLIC PANEL
ALT: 18 IU/L (ref 0–32)
AST: 24 IU/L (ref 0–40)
Albumin/Globulin Ratio: 1.8 (ref 1.2–2.2)
Albumin: 4.6 g/dL (ref 3.9–5.0)
Alkaline Phosphatase: 67 IU/L (ref 44–121)
BUN/Creatinine Ratio: 22 (ref 9–23)
BUN: 17 mg/dL (ref 6–20)
Bilirubin Total: 0.9 mg/dL (ref 0.0–1.2)
CO2: 21 mmol/L (ref 20–29)
Calcium: 9.8 mg/dL (ref 8.7–10.2)
Chloride: 102 mmol/L (ref 96–106)
Creatinine, Ser: 0.78 mg/dL (ref 0.57–1.00)
Globulin, Total: 2.6 g/dL (ref 1.5–4.5)
Glucose: 83 mg/dL (ref 65–99)
Potassium: 4.8 mmol/L (ref 3.5–5.2)
Sodium: 137 mmol/L (ref 134–144)
Total Protein: 7.2 g/dL (ref 6.0–8.5)
eGFR: 109 mL/min/{1.73_m2} (ref >59)

## 2021-04-01 LAB — VITAMIN B12: Vitamin B-12: 676 pg/mL (ref 232–1245)

## 2021-04-12 DIAGNOSIS — F411 Generalized anxiety disorder: Secondary | ICD-10-CM | POA: Diagnosis not present

## 2021-04-28 DIAGNOSIS — Z419 Encounter for procedure for purposes other than remedying health state, unspecified: Secondary | ICD-10-CM | POA: Diagnosis not present

## 2021-05-17 DIAGNOSIS — F411 Generalized anxiety disorder: Secondary | ICD-10-CM | POA: Diagnosis not present

## 2021-05-28 DIAGNOSIS — Z419 Encounter for procedure for purposes other than remedying health state, unspecified: Secondary | ICD-10-CM | POA: Diagnosis not present

## 2021-06-10 DIAGNOSIS — B9689 Other specified bacterial agents as the cause of diseases classified elsewhere: Secondary | ICD-10-CM | POA: Diagnosis not present

## 2021-06-10 DIAGNOSIS — J019 Acute sinusitis, unspecified: Secondary | ICD-10-CM | POA: Diagnosis not present

## 2021-06-11 ENCOUNTER — Encounter (HOSPITAL_COMMUNITY): Payer: Self-pay | Admitting: Emergency Medicine

## 2021-06-11 ENCOUNTER — Emergency Department (HOSPITAL_COMMUNITY)
Admission: EM | Admit: 2021-06-11 | Discharge: 2021-06-11 | Disposition: A | Discharge status: Home / Self Care | Payer: No Typology Code available for payment source | Attending: Emergency Medicine | Admitting: Emergency Medicine

## 2021-06-11 ENCOUNTER — Other Ambulatory Visit: Payer: Self-pay

## 2021-06-11 DIAGNOSIS — T63441A Toxic effect of venom of bees, accidental (unintentional), initial encounter: Secondary | ICD-10-CM | POA: Diagnosis not present

## 2021-06-11 DIAGNOSIS — R21 Rash and other nonspecific skin eruption: Secondary | ICD-10-CM | POA: Insufficient documentation

## 2021-06-11 DIAGNOSIS — T7840XA Allergy, unspecified, initial encounter: Secondary | ICD-10-CM

## 2021-06-11 DIAGNOSIS — T782XXA Anaphylactic shock, unspecified, initial encounter: Secondary | ICD-10-CM | POA: Diagnosis not present

## 2021-06-11 MED ORDER — EPINEPHRINE 0.3 MG/0.3ML IJ SOAJ: 0.3000 mg | INTRAMUSCULAR | 0 refills | Status: DC | PRN

## 2021-06-11 NOTE — ED Provider Notes (Signed)
[facility] EMERGENCY DEPARTMENT Provider Note   CSN: [id number] Arrival date & time: 06/11/21  1227     History Chief Complaint  Patient presents with   Allergic Reaction    Beverly Norris is a 25 y.o. female.  HPI  Patient presents for anaphylaxis.  He was stung by yellow jackets this morning and seen at the urgent care.  While there she had a close, they gave her epinephrine and sent her here about 30 minutes ago.  She reports having hives all across her body which have improved with the epinephrine, Solu-Medrol, Pepcid, Benadryl.  She is not having any chest pain.  She currently does not feel like her throat is swollen or if that she is having any difficulty breathing or handling secretions.  She denies any itching at the moment.  History reviewed. No pertinent past medical history.  Patient Active Problem List   Diagnosis Date Noted   Migraine without aura and without status migrainosus, not intractable 03/09/2021   Chronic left-sided thoracic back pain 02/12/2021   Chronic nonintractable headache 02/12/2021   History of UTI 02/12/2021   Supervision of low-risk first pregnancy, third trimester 12/31/2018    Past Surgical History:  Procedure Laterality Date   WISDOM TOOTH EXTRACTION       OB History     Gravida  2   Para  1   Term  1   Preterm      AB  1   Living  1      SAB      IAB  1   Ectopic      Multiple  0   Live Births  1           Family History  Problem Relation Age of Onset   Diabetes Mother    Diabetes Maternal Grandmother    Stroke Maternal Grandmother    Diabetes Maternal Grandfather    Kidney disease Maternal Grandfather    Heart attack Maternal Grandfather    Diabetes Paternal Grandmother    Diabetes Paternal Grandfather     Social History   Tobacco Use   Smoking status: Never   Smokeless tobacco: Never  Vaping Use   Vaping Use: Never used  Substance Use Topics   Alcohol use: Yes     Comment: occasional    Drug use: Never    Home Medications Prior to Admission medications   Medication Sig Start Date End Date Taking? Authorizing Provider  levonorgestrel (MIRENA, 52 MG,) 20 MCG/24HR IUD See admin instructions.    [provider]  Multiple Vitamin (MULTI VITAMIN) TABS 1 tablet    [provider]    Allergies    Patient has no known allergies.  Review of Systems   Review of Systems  Constitutional:  Negative for chills and fever.  HENT:  Negative for ear pain, sore throat and trouble swallowing.   Eyes:  Negative for pain and visual disturbance.  Respiratory:  Negative for cough and shortness of breath.   Cardiovascular:  Negative for chest pain and palpitations.  Gastrointestinal:  Negative for abdominal pain and vomiting.  Genitourinary:  Negative for dysuria and hematuria.  Musculoskeletal:  Negative for arthralgias and back pain.  Skin:  Positive for rash. Negative for color change.  Neurological:  Negative for seizures and syncope.  All other systems reviewed and are negative.  Physical Exam Updated Vital Signs BP 117/74 (BP Location: Right Arm)   Pulse 93   Temp 98.7 F (  37.1 C) (Oral)   Resp 18   Ht 5\' 4"  (1.626 m)   Wt 68 kg   SpO2 99%   BMI 25.75 kg/m   Physical Exam Vitals and nursing note reviewed. Exam conducted with a chaperone present.  Constitutional:      Appearance: Normal appearance.     Comments: Patient is resting comfortably  HENT:     Head: Normocephalic and atraumatic.     Mouth/Throat:     Comments: Airway is patent, uvula is midline.  Patient is handling secretions appropriately. Eyes:     General: No scleral icterus.       Right eye: No discharge.        Left eye: No discharge.     Extraocular Movements: Extraocular movements intact.     Pupils: Pupils are equal, round, and reactive to light.  Cardiovascular:     Rate and Rhythm: Normal rate and regular rhythm.     Pulses: Normal pulses.     Heart  sounds: Normal heart sounds. No murmur heard.   No friction rub. No gallop.     Comments: Heart rate is in the mid 90s, she is not tachycardic.  No murmurs or rubs on exam. Pulmonary:     Effort: Pulmonary effort is normal. No respiratory distress.     Breath sounds: Normal breath sounds.     Comments: Pseaking in complete sentences, lungs clear to auscultation bilaterally.  Abdominal:     General: Abdomen is flat. Bowel sounds are normal. There is no distension.     Palpations: Abdomen is soft.     Tenderness: There is no abdominal tenderness.  Skin:    General: Skin is warm and dry.     Coloration: Skin is not jaundiced.     Findings: Rash present.     Comments: There are hives along the mid back, but not on the thighs, chest, arms.  Negative dermatographia   Neurological:     Mental Status: She is alert. Mental status is at baseline.     Coordination: Coordination normal.    ED Results / Procedures / Treatments   Labs (all labs ordered are listed, but only abnormal results are displayed) Labs Reviewed - No data to display  EKG None  Radiology No results found.  Procedures Procedures   Medications Ordered in ED Medications - No data to display  ED Course  I have reviewed the triage vital signs and the nursing notes.  Pertinent labs & imaging results that were available during my care of the patient were reviewed by me and considered in my medical decision making (see chart for details).    MDM Rules/Calculators/A&P                          Patient has stable vitals, she is resting comfortably.  She is not having any chest pain, shortness of breath, hypotension, tachycardia.  She is not currently anaphylaxis.  She was given epinephrine about 30 minutes ago, so we will keep her for observation for the next 5 hours.  Continue to reassess.  She was given medication at the urgent care so we will hold off on giving any additional steroids, Benadryl, Pepcid at this  time.  Patient has been observed for almost 5 hours.  Patient is feeling fine.  She has no chest tightness chest pain or shortness of breath.  She is breathing easily and unlabored.  Her vitals are stable, she is  not tachycardic.  At this time patient is appropriate for discharge.  Follow-up instructions given as well as return precautions.  Patient voices understanding and agreement with the plan. Final Clinical Impression(s) / ED Diagnoses Final diagnoses:  None    Rx / DC Orders ED Discharge Orders     None        Theron Arista, PA-C 06/11/21 1653    Melene Plan, DO 06/12/21 1511

## 2021-06-11 NOTE — ED Notes (Signed)
Pt verbalizes understanding of discharge instructions. Opportunity for questions and answers were provided. Pt discharged from the ED.   ?

## 2021-06-11 NOTE — ED Triage Notes (Addendum)
Pt BIB [facility] for allergic reaction. Pt was mowing her yard and got stuck by yellow jackets. Went to UC, given epi pen, IM solumedrol (125), IM benadryl (50). Pt endorses feeling like her throat was closing, also had hives. Pt appears in NAD. Respirations even/unlabored. Airway intact. Able to speak in complete sentences.

## 2021-06-11 NOTE — Discharge Instructions (Signed)
You are seen today for an allergic reaction.  Please continue to take Pepcid at home as needed as well as Benadryl and Claritin.  Should help reduce any hives that may be lingering.  We expect that you will be improving fully by the end of the night.  If things worsen or change please return back to the ED for further evaluation.

## 2021-06-21 DIAGNOSIS — F411 Generalized anxiety disorder: Secondary | ICD-10-CM | POA: Diagnosis not present

## 2021-06-28 DIAGNOSIS — Z419 Encounter for procedure for purposes other than remedying health state, unspecified: Secondary | ICD-10-CM | POA: Diagnosis not present

## 2021-07-07 DIAGNOSIS — F411 Generalized anxiety disorder: Secondary | ICD-10-CM | POA: Diagnosis not present

## 2021-07-29 DIAGNOSIS — Z419 Encounter for procedure for purposes other than remedying health state, unspecified: Secondary | ICD-10-CM | POA: Diagnosis not present

## 2021-07-30 DIAGNOSIS — M25531 Pain in right wrist: Secondary | ICD-10-CM | POA: Diagnosis not present

## 2021-07-30 DIAGNOSIS — S62394A Other fracture of fourth metacarpal bone, right hand, initial encounter for closed fracture: Secondary | ICD-10-CM | POA: Diagnosis not present

## 2021-07-30 DIAGNOSIS — M79641 Pain in right hand: Secondary | ICD-10-CM | POA: Diagnosis not present

## 2021-07-30 DIAGNOSIS — S62304A Unspecified fracture of fourth metacarpal bone, right hand, initial encounter for closed fracture: Secondary | ICD-10-CM | POA: Diagnosis not present

## 2021-07-30 DIAGNOSIS — S62324A Displaced fracture of shaft of fourth metacarpal bone, right hand, initial encounter for closed fracture: Secondary | ICD-10-CM | POA: Diagnosis not present

## 2021-08-04 DIAGNOSIS — S62324A Displaced fracture of shaft of fourth metacarpal bone, right hand, initial encounter for closed fracture: Secondary | ICD-10-CM | POA: Diagnosis not present

## 2021-08-04 DIAGNOSIS — M79641 Pain in right hand: Secondary | ICD-10-CM | POA: Diagnosis not present

## 2021-08-11 DIAGNOSIS — S62324A Displaced fracture of shaft of fourth metacarpal bone, right hand, initial encounter for closed fracture: Secondary | ICD-10-CM | POA: Diagnosis not present

## 2021-08-16 ENCOUNTER — Other Ambulatory Visit: Payer: Self-pay

## 2021-08-16 ENCOUNTER — Encounter (HOSPITAL_BASED_OUTPATIENT_CLINIC_OR_DEPARTMENT_OTHER): Payer: Self-pay | Admitting: Orthopedic Surgery

## 2021-08-16 NOTE — Progress Notes (Addendum)
Spoke w/ via phone for pre-op interview---pt Lab needs dos----  urine poct per anesthesia, surgery orders pending             Lab results------none COVID test -----patient states asymptomatic no test needed Arrive at -------930 am 08-19-2021 NPO after MN NO Solid Food.  Clear liquids from MN until---830 am Med rec completed Medications to take morning of surgery -----none Diabetic medication -----n/a Patient instructed no nail polish to be worn day of surgery Patient instructed to bring photo id and insurance card day of surgery Patient aware to have Driver (ride ) / caregiver  spouse Beverly Norris worthy cell [phone] wilkl p;ick pt up   for 24 hours after surgery  Patient Special Instructions -----none Pre-Op special Istructions -----surgery orders req dr Yehuda Budd epic ib Patient verbalized understanding of instructions that were given at this phone interview. Patient denies shortness of breath, chest pain, fever, cough at this phone interview.

## 2021-08-24 DIAGNOSIS — M79641 Pain in right hand: Secondary | ICD-10-CM | POA: Diagnosis not present

## 2021-08-24 DIAGNOSIS — S62324A Displaced fracture of shaft of fourth metacarpal bone, right hand, initial encounter for closed fracture: Secondary | ICD-10-CM | POA: Diagnosis not present

## 2021-08-26 ENCOUNTER — Ambulatory Visit (HOSPITAL_BASED_OUTPATIENT_CLINIC_OR_DEPARTMENT_OTHER)
Admission: RE | Admit: 2021-08-26 | Payer: No Typology Code available for payment source | Source: Ambulatory Visit | Admitting: Orthopedic Surgery

## 2021-08-26 HISTORY — DX: Presence of spectacles and contact lenses: Z97.3

## 2021-08-26 HISTORY — DX: Displaced fracture of neck of fourth metacarpal bone, right hand, initial encounter for closed fracture: S62.334A

## 2021-08-26 SURGERY — OPEN REDUCTION INTERNAL FIXATION (ORIF) METACARPAL
Anesthesia: Regional | Site: Finger | Laterality: Right

## 2021-08-28 DIAGNOSIS — Z419 Encounter for procedure for purposes other than remedying health state, unspecified: Secondary | ICD-10-CM | POA: Diagnosis not present

## 2021-09-19 IMAGING — CR DG THORACIC SPINE 3V
3 series · 3 of 3 positions shown · non-contrast
Comparison: None.

CLINICAL DATA: Intermittent thoracic spine pain for 6 months.

EXAM:
THORACIC SPINE - 3 VIEWS

[w thoracic spine ap]
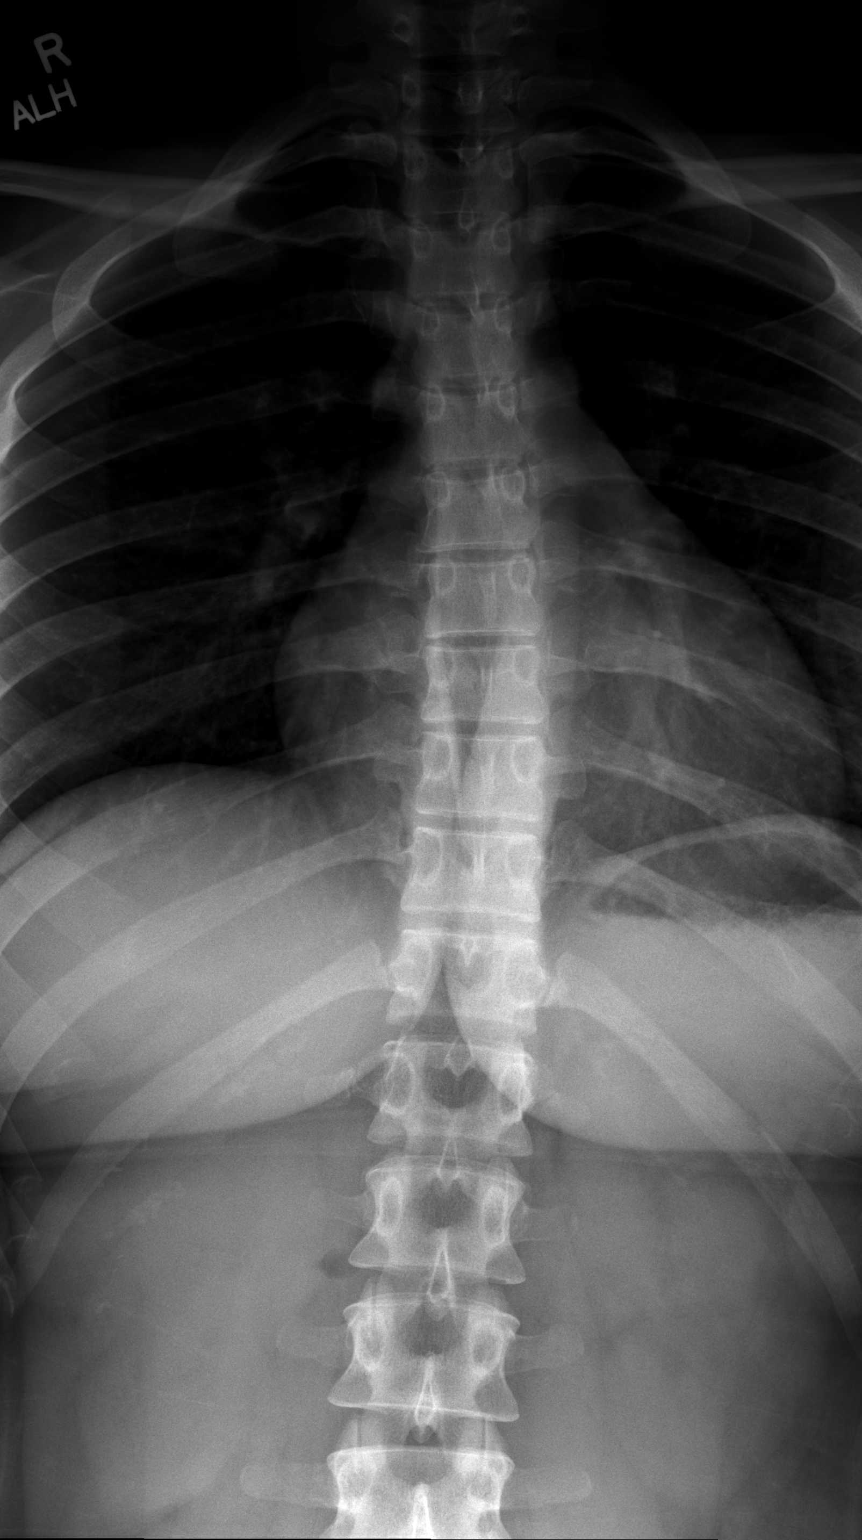

[w thoracic spine lat]
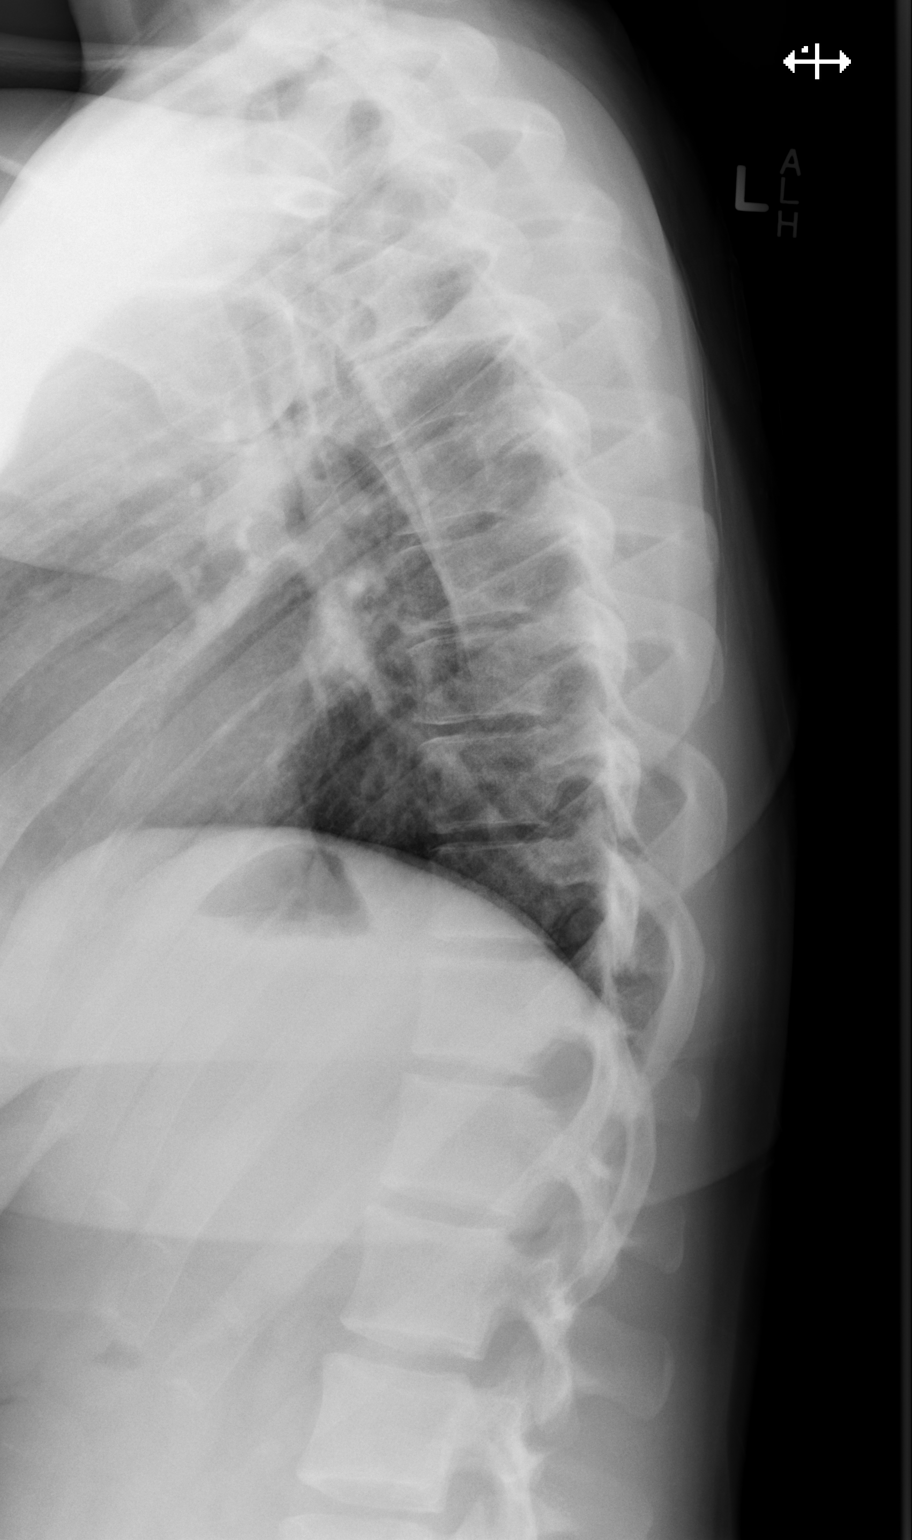

[w thoracic swimmers]
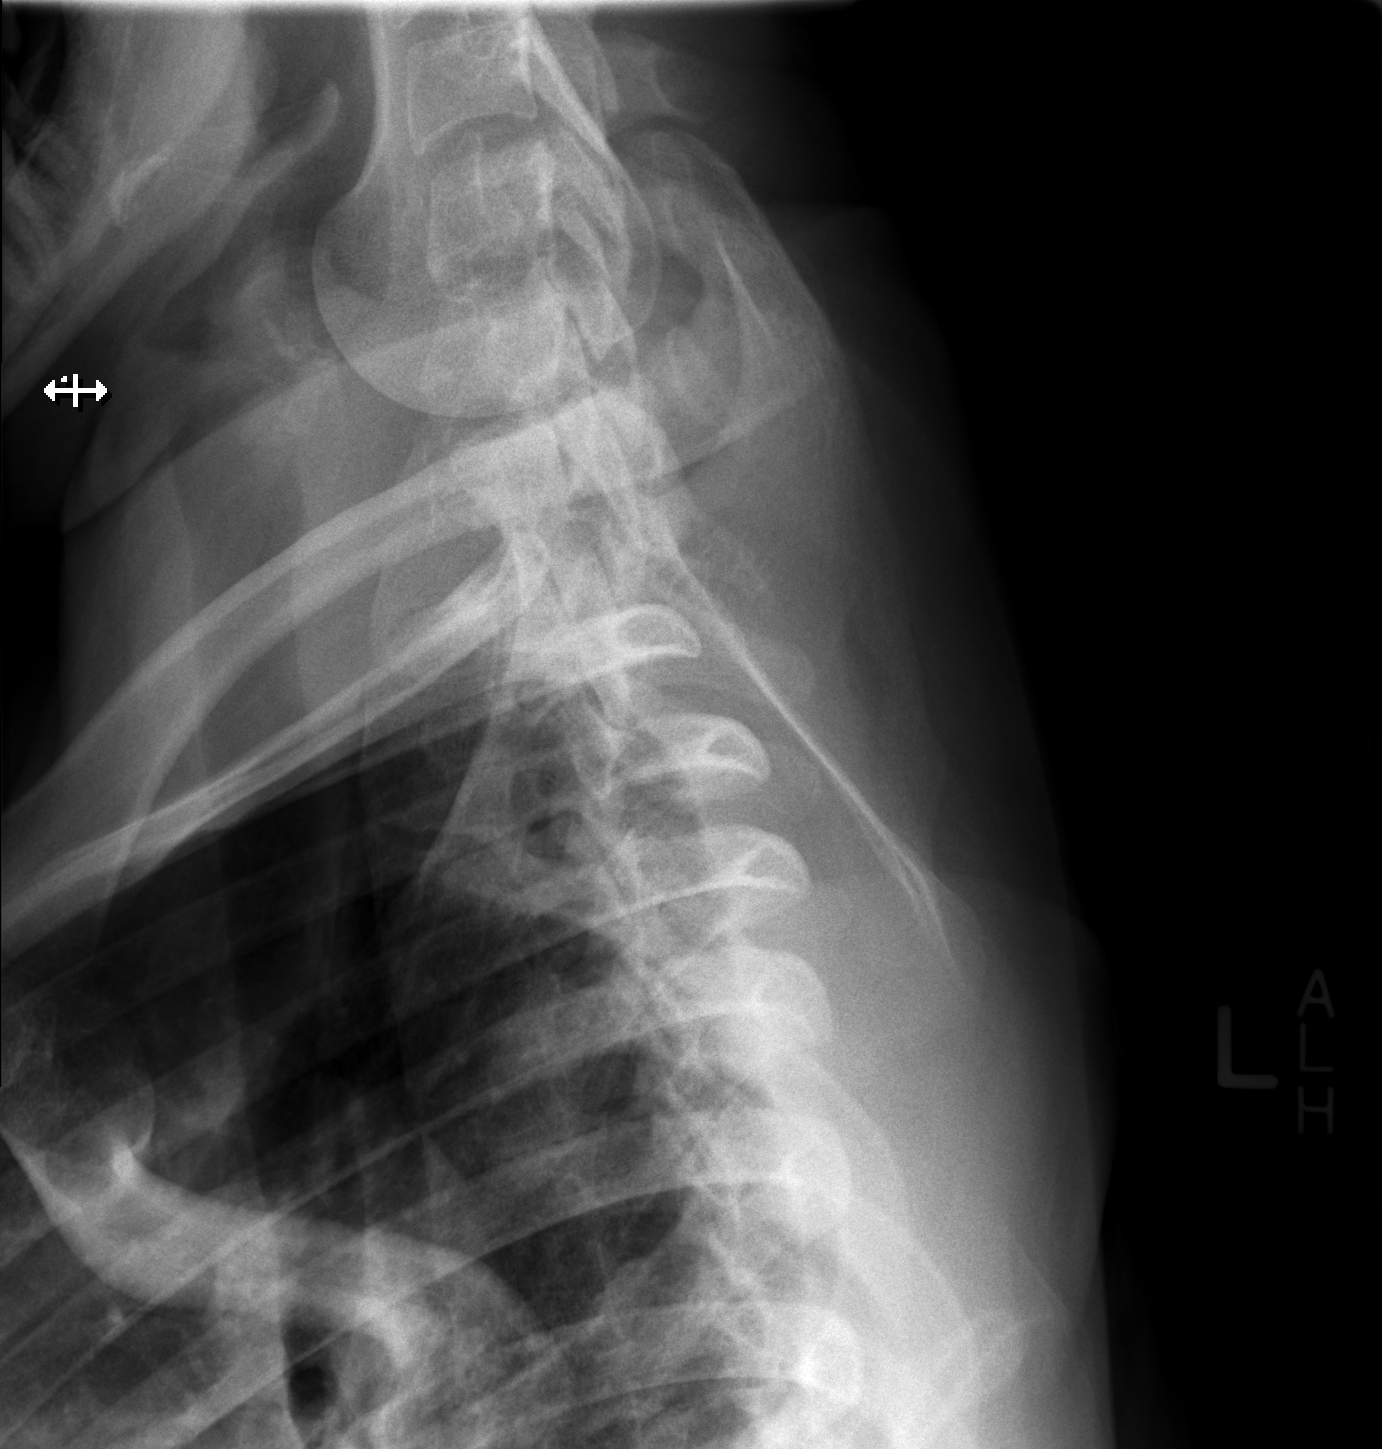

[3 of 3 positions shown; findings below may reference images not displayed]

FINDINGS: There is no evidence of thoracic spine fracture. Alignment is
normal. Mild convex left curvature may be positional. No other
significant bone abnormalities are identified.
IMPRESSION: Mild convex left curvature may be positional. The exam is otherwise
negative.

## 2021-09-28 DIAGNOSIS — Z419 Encounter for procedure for purposes other than remedying health state, unspecified: Secondary | ICD-10-CM | POA: Diagnosis not present

## 2021-10-25 DIAGNOSIS — K921 Melena: Secondary | ICD-10-CM | POA: Diagnosis not present

## 2021-10-25 DIAGNOSIS — R102 Pelvic and perineal pain: Secondary | ICD-10-CM | POA: Diagnosis not present

## 2021-10-28 DIAGNOSIS — Z419 Encounter for procedure for purposes other than remedying health state, unspecified: Secondary | ICD-10-CM | POA: Diagnosis not present

## 2021-11-01 DIAGNOSIS — K625 Hemorrhage of anus and rectum: Secondary | ICD-10-CM | POA: Diagnosis not present

## 2021-11-03 DIAGNOSIS — R102 Pelvic and perineal pain: Secondary | ICD-10-CM | POA: Diagnosis not present

## 2021-11-28 DIAGNOSIS — Z419 Encounter for procedure for purposes other than remedying health state, unspecified: Secondary | ICD-10-CM | POA: Diagnosis not present

## 2021-12-29 DIAGNOSIS — Z419 Encounter for procedure for purposes other than remedying health state, unspecified: Secondary | ICD-10-CM | POA: Diagnosis not present

## 2022-01-26 DIAGNOSIS — Z419 Encounter for procedure for purposes other than remedying health state, unspecified: Secondary | ICD-10-CM | POA: Diagnosis not present

## 2022-02-25 DIAGNOSIS — R350 Frequency of micturition: Secondary | ICD-10-CM | POA: Diagnosis not present

## 2022-02-26 DIAGNOSIS — Z419 Encounter for procedure for purposes other than remedying health state, unspecified: Secondary | ICD-10-CM | POA: Diagnosis not present

## 2022-03-28 DIAGNOSIS — Z419 Encounter for procedure for purposes other than remedying health state, unspecified: Secondary | ICD-10-CM | POA: Diagnosis not present

## 2022-04-07 NOTE — Progress Notes (Signed)
? ?Established Patient Office Visit ? ?Subjective:  ?Patient ID: Beverly Norris, female    DOB: 11-Apr-1996  Age: 26 y.o. MRN: [id number] ? ?CC:  ?Chief Complaint  ?Patient presents with  ? Annual Exam  ?  None fasting CPE- Pt goes to [place] for her paps. She also has migraines  ? ? ?HPI ?Beverly Norris presents for an annual exam; reports that she has been getting migraines more frequently with pain at her temples about 1 - 2 times a week; feels the pain on both sides; denies having an aura; OTC excedrin migraine helps the headaches sometimes along with caffeine; typically has 1 serving of caffeine a day; sleep deprivation makes headaches worse; feels a stiff neck right before headaches start; drinks 80 ounces of water a day; exercises with running on lunch breaks; sleeps 7 hours at night; played soccer in childhood and has had 3 concussions in the past; reports she will check with [facility] about hpv vaccines; reports that she will make an appointment to return soon and believes she's had 1 HPV vaccine when she lived in [place] and [place] told her they could draw blood to check on her status.  ? ?Outpatient Medications Prior to Visit  ?Medication Sig Dispense Refill  ? acetaminophen (TYLENOL) 500 MG tablet Take 500-1,000 mg by mouth every 6 (six) hours as needed for mild pain or headache.    ? amoxicillin (AMOXIL) 875 MG tablet     ? aspirin-acetaminophen-caffeine (EXCEDRIN MIGRAINE) 250-250-65 MG tablet Take 1 tablet by mouth every 6 (six) hours as needed for headache or migraine.    ? EPINEPHrine 0.3 mg/0.3 mL IJ SOAJ injection Inject 0.3 mg into the muscle as needed for anaphylaxis. 2 each 0  ? levonorgestrel (MIRENA, 52 MG,) 20 MCG/24HR IUD 1 each by Intrauterine route See admin instructions. Place 1 new device (after removal of former one) up to every 5 years    ? ?No facility-administered medications prior to visit.  ? ? ?Allergies  ?Allergen Reactions  ? Other Anaphylaxis, Swelling  and Other (See Comments)  ?  ALL INSECT BITES/STINGS  ? [place] Venom Anaphylaxis and Swelling  ? ? ?Patient Care Team: ?Marcellina Millin as PCP - General (Physician Assistant) ? ?ROS ?Review of Systems  ?Constitutional:  Negative for activity change and fever.  ?HENT:  Negative for congestion, ear pain and voice change.   ?Eyes:  Positive for photophobia. Negative for redness.  ?Respiratory:  Negative for cough.   ?Cardiovascular:  Negative for chest pain.  ?Gastrointestinal:  Positive for nausea. Negative for constipation and diarrhea.  ?Endocrine: Negative for polyuria.  ?Genitourinary:  Negative for flank pain.  ?Musculoskeletal:  Negative for gait problem and neck stiffness.  ?Skin:  Negative for color change and rash.  ?Neurological:  Positive for headaches. Negative for dizziness and numbness.  ?Hematological:  Negative for adenopathy.  ?Psychiatric/Behavioral:  Negative for agitation, behavioral problems and confusion.   ? ?  ?Objective:  ?  ?Physical Exam ?Vitals and nursing note reviewed.  ?Constitutional:   ?   General: She is not in acute distress. ?   Appearance: Normal appearance.  ?HENT:  ?   Head: Normocephalic and atraumatic.  ?   Right Ear: Tympanic membrane, ear canal and external ear normal.  ?   Left Ear: Tympanic membrane, ear canal and external ear normal.  ?Eyes:  ?   Conjunctiva/sclera: Conjunctivae normal.  ?   Pupils: Pupils are equal, round, and reactive to light.  ?  Neck:  ?   Vascular: No carotid bruit.  ?Cardiovascular:  ?   Rate and Rhythm: Normal rate and regular rhythm.  ?   Pulses: Normal pulses.  ?   Heart sounds: Normal heart sounds.  ?Pulmonary:  ?   Effort: Pulmonary effort is normal. No respiratory distress.  ?   Breath sounds: Normal breath sounds. No wheezing.  ?Abdominal:  ?   General: Bowel sounds are normal.  ?   Palpations: Abdomen is soft.  ?Musculoskeletal:     ?   General: Normal range of motion.  ?   Cervical back: Normal range of motion and neck supple.   ?   Right lower leg: No edema.  ?   Left lower leg: No edema.  ?Skin: ?   General: Skin is warm and dry.  ?   Findings: No rash.  ?Neurological:  ?   General: No focal deficit present.  ?   Mental Status: She is alert and oriented to person, place, and time.  ?   Gait: Gait normal.  ?Psychiatric:     ?   Mood and Affect: Mood normal.     ?   Behavior: Behavior normal.  ? ? ?BP 120/70   Pulse 73   Ht 5' 4"  (1.626 m)   Wt 165 lb 9.6 oz (75.1 kg)   SpO2 97%   BMI 28.43 kg/m?  ? ?Wt Readings from Last 3 Encounters:  ?04/08/22 165 lb 9.6 oz (75.1 kg)  ?06/11/21 150 lb (68 kg)  ?03/31/21 151 lb (68.5 kg)  ? ? ?Results for orders placed or performed in visit on 03/31/21  ?Comprehensive metabolic panel  ?Result Value Ref Range  ? Glucose 83 65 - 99 mg/dL  ? BUN 17 6 - 20 mg/dL  ? Creatinine, Ser 0.78 0.57 - 1.00 mg/dL  ? eGFR 109 >59 mL/min/1.73  ? BUN/Creatinine Ratio 22 9 - 23  ? Sodium 137 134 - 144 mmol/L  ? Potassium 4.8 3.5 - 5.2 mmol/L  ? Chloride 102 96 - 106 mmol/L  ? CO2 21 20 - 29 mmol/L  ? Calcium 9.8 8.7 - 10.2 mg/dL  ? Total Protein 7.2 6.0 - 8.5 g/dL  ? Albumin 4.6 3.9 - 5.0 g/dL  ? Globulin, Total 2.6 1.5 - 4.5 g/dL  ? Albumin/Globulin Ratio 1.8 1.2 - 2.2  ? Bilirubin Total 0.9 0.0 - 1.2 mg/dL  ? Alkaline Phosphatase 67 44 - 121 IU/L  ? AST 24 0 - 40 IU/L  ? ALT 18 0 - 32 IU/L  ?Vitamin B12  ?Result Value Ref Range  ? Vitamin B-12 676 232 - 1,245 pg/mL  ?  ? ?Last CBC ?Lab Results  ?Component Value Date  ? WBC 8.1 02/12/2021  ? HGB 13.3 02/12/2021  ? HCT 39.4 02/12/2021  ? MCV 91 02/12/2021  ? MCH 30.7 02/12/2021  ? RDW 11.7 02/12/2021  ? PLT 309 02/12/2021  ? ?Last metabolic panel ?Lab Results  ?Component Value Date  ? GLUCOSE 83 03/31/2021  ? NA 137 03/31/2021  ? K 4.8 03/31/2021  ? CL 102 03/31/2021  ? CO2 21 03/31/2021  ? BUN 17 03/31/2021  ? CREATININE 0.78 03/31/2021  ? EGFR 109 03/31/2021  ? CALCIUM 9.8 03/31/2021  ? PROT 7.2 03/31/2021  ? ALBUMIN 4.6 03/31/2021  ? LABGLOB 2.6 03/31/2021  ?  AGRATIO 1.8 03/31/2021  ? BILITOT 0.9 03/31/2021  ? ALKPHOS 67 03/31/2021  ? AST 24 03/31/2021  ? ALT 18 03/31/2021  ? ANIONGAP  9 12/31/2018  ? ?Last lipids ?No results found for: CHOL, HDL, LDLCALC, LDLDIRECT, TRIG, CHOLHDL ?Last hemoglobin A1c ?No results found for: HGBA1C ?Last thyroid functions ?Lab Results  ?Component Value Date  ? TSH 0.940 02/12/2021  ? ?Last vitamin D ?No results found for: 25OHVITD2, [place], VD25OH ?Last vitamin B12 and Folate ?Lab Results  ?Component Value Date  ? OVFIEPPI95 676 03/31/2021  ? ?  ? ?The ASCVD Risk score (Arnett DK, et al., 2019) failed to calculate for the following reasons: ?  The 2019 ASCVD risk score is only valid for ages 72 to 57 ? ?  ?Assessment & Plan:  ? ?Problem List Items Addressed This Visit   ? ?  ? Cardiovascular and Mediastinum  ? Migraine without aura and without status migrainosus, not intractable  ? Relevant Medications  ? rizatriptan (MAXALT-MLT) 5 MG disintegrating tablet  ? Other Relevant Orders  ? CBC with Differential/Platelet  ? Comprehensive metabolic panel  ?  ? Other  ? Family history of hyperlipidemia  ? Relevant Orders  ? Lipid panel  ? Ambulatory referral to Neurology  ? ?Other Visit Diagnoses   ? ? Routine medical exam    -  Primary  ? Relevant Orders  ? CBC with Differential/Platelet  ? Comprehensive metabolic panel  ? Lipid panel  ? Need for hepatitis C screening test      ? Relevant Orders  ? Hepatitis C antibody  ? ?  ? ? ?Meds ordered this encounter  ?Medications  ? rizatriptan (MAXALT-MLT) 5 MG disintegrating tablet  ?  Sig: Take 1 tablet (5 mg total) by mouth as needed for migraine. Take 1 tablet by mouth at onset of migraine headache. May repeat in 2 hours if needed. Maxiumum dose is 20 mg per 24 hours.  ?  Dispense:  20 tablet  ?  Refill:  1  ?  Order Specific Question:   Supervising Provider  ?  Answer:   Denita Lung [[id number]]  ? ? ?Follow-up: Return in about 1 year (around 04/09/2023) for Return for Annual Exam with PCP Jimmye Norman.   ? ? ?Irene Pap, PA-C ?

## 2022-04-08 ENCOUNTER — Encounter: Payer: Self-pay | Admitting: Physician Assistant

## 2022-04-08 ENCOUNTER — Ambulatory Visit (INDEPENDENT_AMBULATORY_CARE_PROVIDER_SITE_OTHER): Payer: BC Managed Care – PPO | Admitting: Physician Assistant

## 2022-04-08 VITALS — BP 120/70 | HR 73 | Ht 64.0 in | Wt 165.6 lb

## 2022-04-08 DIAGNOSIS — Z0000 Encounter for general adult medical examination without abnormal findings: Secondary | ICD-10-CM | POA: Diagnosis not present

## 2022-04-08 DIAGNOSIS — Z83438 Family history of other disorder of lipoprotein metabolism and other lipidemia: Secondary | ICD-10-CM

## 2022-04-08 DIAGNOSIS — Z6828 Body mass index (BMI) 28.0-28.9, adult: Secondary | ICD-10-CM | POA: Diagnosis not present

## 2022-04-08 DIAGNOSIS — Z1159 Encounter for screening for other viral diseases: Secondary | ICD-10-CM | POA: Diagnosis not present

## 2022-04-08 DIAGNOSIS — G43009 Migraine without aura, not intractable, without status migrainosus: Secondary | ICD-10-CM | POA: Diagnosis not present

## 2022-04-08 MED ORDER — RIZATRIPTAN BENZOATE 5 MG PO TBDP: 5.0000 mg | ORAL_TABLET | ORAL | 1 refills | Status: DC | PRN

## 2022-04-08 NOTE — Assessment & Plan Note (Signed)
Stable, will monitor 

## 2022-04-08 NOTE — Patient Instructions (Signed)
Preventative Care for Adults - Female ?   ?  MAINTAIN REGULAR HEALTH EXAMS: ?A routine yearly physical is a good way to check in with your primary care provider about your health and preventive screening. It is also an opportunity to share updates about your health and any concerns you have, and receive a thorough all-over exam.  ?Most health insurance companies pay for at least some preventative services.  Check with your health plan for specific coverages. ? ?WHAT PREVENTATIVE SERVICES DO WOMEN NEED? ?Adult women should have their weight and blood pressure checked regularly.  ?Women age 26 and older should have their cholesterol levels checked regularly. ?Women should be screened for cervical cancer with a Pap smear and pelvic exam beginning at either age 26, or 3 years after they become sexually activity.   ?Breast cancer screening generally begins at age 26 with a mammogram and breast exam by your primary care provider.   ?Beginning at age 26 and continuing to age 75, women should be screened for colorectal cancer.  Certain people may need continued testing until age 85. ?Updating vaccinations is part of preventative care.  Vaccinations help protect against diseases such as the flu. ?Osteoporosis is a disease in which the bones lose minerals and strength as we age. Women ages 65 and over should discuss this with their caregivers, as should women after menopause who have other risk factors. ?Lab tests are generally done as part of preventative care to screen for anemia and blood disorders, to screen for problems with the kidneys and liver, to screen for bladder problems, to check blood sugar, and to check your cholesterol level. ?Preventative services generally include counseling about diet, exercise, avoiding tobacco, drugs, excessive alcohol consumption, and sexually transmitted infections.   ? ?GENERAL RECOMMENDATIONS FOR GOOD HEALTH: ? ?Healthy diet: ?Eat a variety of foods, including fruit, vegetables,  animal or vegetable protein, such as meat, fish, chicken, and eggs, or beans, lentils, tofu, and grains, such as rice. ?Drink plenty of water daily (60 - 80 ounces or 8 - 10 glasses a day) ?Decrease saturated fat in the diet, avoid lots of red meat, processed foods, sweets, fast foods, and fried foods. For high cholesterol - Increase fiber intake (Benefiber or Metamucil, Cherrios,  oatmeal, beans, nuts, fruits and vegetables), limit saturated fats (in fried foods, red meat), can add OTC fish oil supplement, eat fish with Omega-3 fatty acids like salmon and tuna, exercise for 30 minutes 3 - 5 times a week, drink 8 - 10 glasses of water a day ? ?Exercise: ?Aerobic exercise helps maintain good heart health. At least 30-40 minutes of moderate-intensity exercise is recommended. For example, a brisk walk that increases your heart rate and breathing. This should be done on most days of the week.  ?Find a type of exercise or a variety of exercises that you enjoy so that it becomes a part of your daily life.  Examples are running, walking, swimming, water aerobics, and biking.  For motivation and support, explore group exercise such as aerobic class, spin class, Zumba, Yoga,or  martial arts, etc.   ?Set exercise goals for yourself, such as a certain weight goal, walk or run in a race such as a 5k walk/run.  Speak to your primary care provider about exercise goals. ? ?Disease prevention: ?If you smoke or chew tobacco, find out from your caregiver how to quit. It can literally save your life, no matter how long you have been a tobacco user. If you do not   use tobacco, never begin.  ?Maintain a healthy diet and normal weight. Increased weight leads to problems with blood pressure and diabetes.  ?The Body Mass Index or BMI is a way of measuring how much of your body is fat. Having a BMI above 27 increases the risk of heart disease, diabetes, hypertension, stroke and other problems related to obesity. Your caregiver can help  determine your BMI and based on it develop an exercise and dietary program to help you achieve or maintain this important measurement at a healthful level. ?High blood pressure causes heart and blood vessel problems.  Persistent high blood pressure should be treated with medicine if weight loss and exercise do not work.  ?Fat and cholesterol leaves deposits in your arteries that can block them. This causes heart disease and vessel disease elsewhere in your body.  If your cholesterol is found to be high, or if you have heart disease or certain other medical conditions, then you may need to have your cholesterol monitored frequently and be treated with medication.  ?Ask if you should have a cardiac stress test if your history suggests this. A stress test is a test done on a treadmill that looks for heart disease. This test can find disease prior to there being a problem. ?Menopause can be associated with physical symptoms and risks. Hormone replacement therapy is available to decrease these. You should talk to your caregiver about whether starting or continuing to take hormones is right for you.  ?Osteoporosis is a disease in which the bones lose minerals and strength as we age. This can result in serious bone fractures. Risk of osteoporosis can be identified using a bone density scan. Women ages 65 and over should discuss this with their caregivers, as should women after menopause who have other risk factors. Ask your caregiver whether you should be taking a calcium supplement and Vitamin D, to reduce the rate of osteoporosis.  ?Avoid drinking alcohol in excess (more than two drinks per day).  Avoid use of street drugs. Do not share needles with anyone. Ask for professional help if you need assistance or instructions on stopping the use of alcohol, cigarettes, and/or drugs. ?Brush your teeth twice a day with fluoride toothpaste, and floss once a day. Good oral hygiene prevents tooth decay and gum disease. The  problems can be painful, unattractive, and can cause other health problems. Visit your dentist for a routine oral and dental check up and preventive care every 6-12 months.  ?Look at your skin regularly.  Use a mirror to look at your back. Notify your caregivers of changes in moles, especially if there are changes in shapes, colors, a size larger than a pencil eraser, an irregular border, or development of new moles. ? ?Safety: ?Use seatbelts 100% of the time, whether driving or as a passenger.  Use safety devices such as hearing protection if you work in environments with loud noise or significant background noise.  Use safety glasses when doing any work that could send debris in to the eyes.  Use a helmet if you ride a bike or motorcycle.  Use appropriate safety gear for contact sports.  Talk to your caregiver about gun safety. ?Use sunscreen with a SPF (or skin protection factor) of 15 or greater.  Lighter skinned people are at a greater risk of skin cancer. Don?t forget to also wear sunglasses in order to protect your eyes from too much damaging sunlight. Damaging sunlight can accelerate cataract formation.  ?Practice safe sex. Use   condoms. Condoms are used for birth control and to help reduce the spread of sexually transmitted infections (or STIs).  Some of the STIs are gonorrhea (the clap), chlamydia, syphilis, trichomonas, herpes, HPV (human papilloma virus) and HIV (human immunodeficiency virus) which causes AIDS. The herpes, HIV and HPV are viral illnesses that have no cure. These can result in disability, cancer and death.  ?Keep carbon monoxide and smoke detectors in your home functioning at all times. Change the batteries every 6 months or use a model that plugs into the wall.  ? ?Vaccinations: ?Stay up to date with your tetanus shots and other required immunizations. You should have a booster for tetanus every 10 years. Be sure to get your flu shot every year, since 5%-20% of the [place] population comes  down with the flu. The flu vaccine changes each year, so being vaccinated once is not enough. Get your shot in the fall, before the flu season peaks.   ?Other vaccines to consider: ?Human Papilloma Virus or HPV causes

## 2022-04-09 LAB — CBC WITH DIFFERENTIAL/PLATELET
Basophils Absolute: 0.1 10*3/uL (ref 0.0–0.2)
Basos: 1 %
EOS (ABSOLUTE): 0.3 10*3/uL (ref 0.0–0.4)
Eos: 4 %
Hematocrit: 40 % (ref 34.0–46.6)
Hemoglobin: 13.4 g/dL (ref 11.1–15.9)
Immature Grans (Abs): 0 10*3/uL (ref 0.0–0.1)
Immature Granulocytes: 0 %
Lymphocytes Absolute: 2.4 10*3/uL (ref 0.7–3.1)
Lymphs: 28 %
MCH: 30.3 pg (ref 26.6–33.0)
MCHC: 33.5 g/dL (ref 31.5–35.7)
MCV: 91 fL (ref 79–97)
Monocytes Absolute: 0.6 10*3/uL (ref 0.1–0.9)
Monocytes: 7 %
Neutrophils Absolute: 5.2 10*3/uL (ref 1.4–7.0)
Neutrophils: 60 %
Platelets: 317 10*3/uL (ref 150–450)
RBC: 4.42 x10E6/uL (ref 3.77–5.28)
RDW: 11.6 % — ABNORMAL LOW (ref 11.7–15.4)
WBC: 8.6 10*3/uL (ref 3.4–10.8)

## 2022-04-09 LAB — COMPREHENSIVE METABOLIC PANEL
ALT: 14 IU/L (ref 0–32)
AST: 16 IU/L (ref 0–40)
Albumin/Globulin Ratio: 1.9 (ref 1.2–2.2)
Albumin: 4.5 g/dL (ref 3.9–5.0)
Alkaline Phosphatase: 50 IU/L (ref 44–121)
BUN/Creatinine Ratio: 19 (ref 9–23)
BUN: 14 mg/dL (ref 6–20)
Bilirubin Total: 0.4 mg/dL (ref 0.0–1.2)
CO2: 23 mmol/L (ref 20–29)
Calcium: 9.7 mg/dL (ref 8.7–10.2)
Chloride: 105 mmol/L (ref 96–106)
Creatinine, Ser: 0.74 mg/dL (ref 0.57–1.00)
Globulin, Total: 2.4 g/dL (ref 1.5–4.5)
Glucose: 87 mg/dL (ref 70–99)
Potassium: 4.6 mmol/L (ref 3.5–5.2)
Sodium: 140 mmol/L (ref 134–144)
Total Protein: 6.9 g/dL (ref 6.0–8.5)
eGFR: 115 mL/min/{1.73_m2} (ref >59)

## 2022-04-09 LAB — LIPID PANEL
Chol/HDL Ratio: 3 ratio (ref 0.0–4.4)
Cholesterol, Total: 167 mg/dL (ref 100–199)
HDL: 56 mg/dL (ref >39)
LDL Chol Calc (NIH): 92 mg/dL (ref 0–99)
Triglycerides: 105 mg/dL (ref 0–149)
VLDL Cholesterol Cal: 19 mg/dL (ref 5–40)

## 2022-04-09 LAB — HEPATITIS C ANTIBODY: Hep C Virus Ab: NONREACTIVE

## 2022-04-28 DIAGNOSIS — Z419 Encounter for procedure for purposes other than remedying health state, unspecified: Secondary | ICD-10-CM | POA: Diagnosis not present

## 2022-05-23 ENCOUNTER — Ambulatory Visit: Payer: BC Managed Care – PPO | Admitting: Neurology

## 2022-05-25 ENCOUNTER — Encounter: Payer: Self-pay | Admitting: Neurology

## 2022-05-25 ENCOUNTER — Ambulatory Visit (INDEPENDENT_AMBULATORY_CARE_PROVIDER_SITE_OTHER): Payer: BC Managed Care – PPO | Admitting: Neurology

## 2022-05-25 VITALS — BP 127/74 | HR 61 | Ht 64.0 in | Wt 164.6 lb

## 2022-05-25 DIAGNOSIS — G43009 Migraine without aura, not intractable, without status migrainosus: Secondary | ICD-10-CM | POA: Diagnosis not present

## 2022-05-25 MED ORDER — NURTEC 75 MG PO TBDP: 75.0000 mg | ORAL_TABLET | Freq: Every day | ORAL | 0 refills | Status: DC | PRN

## 2022-05-25 MED ORDER — NURTEC 75 MG PO TBDP: 75.0000 mg | ORAL_TABLET | Freq: Every day | ORAL | 11 refills | Status: DC | PRN

## 2022-05-25 NOTE — Patient Instructions (Signed)
Older medications for migraine prevention include (Examples): propranolol or other blood pressure medications, amitriptyline or other antidepressants, Topiramate or other anti-epilepsy drugs. Older medications for acute management include (examples): triptans such as rizatriptan and sumatriptan  Newer medications for acute management: Nurtec, Ubrelvy Newer medications for prevention include: Ajovy, Emgality (injections once monthly) or Qulipta (daily pill). Also Aimovig but this has more side effects than Ajovy/E,gality. Still, the side effect profiles for the newer medications are great. Another option includes: botox for migraines  Nurtec: Can take it as needed once daily or every other day for prevention. Some people take it M-W-F and use the extra tablets (you get 16) as needed. May take this with a triptan or ondansetron.  Rimegepant Disintegrating Tablets What is this medication? RIMEGEPANT (ri ME je pant) prevents and treats migraines. It works by blocking a substance in the body that causes migraines. This medicine may be used for other purposes; ask your health care provider or pharmacist if you have questions. COMMON BRAND NAME(S): NURTEC ODT What should I tell my care team before I take this medication? They need to know if you have any of these conditions: Kidney disease Liver disease An unusual or allergic reaction to rimegepant, other medications, foods, dyes, or preservatives Pregnant or trying to get pregnant Breast-feeding How should I use this medication? Take this medication by mouth. Take it as directed on the prescription label. Leave the tablet in the sealed pack until you are ready to take it. With dry hands, open the pack and gently remove the tablet. If the tablet breaks or crumbles, throw it away. Use a new tablet. Place the tablet in the mouth and allow it to dissolve. Then, swallow it. Do not cut, crush, or chew this medication. You do not need water to take this  medication. Talk to your care team about the use of this medication in children. Special care may be needed. Overdosage: If you think you have taken too much of this medicine contact a poison control center or emergency room at once. NOTE: This medicine is only for you. Do not share this medicine with others. What if I miss a dose? This does not apply. This medication is not for regular use. What may interact with this medication? Certain medications for fungal infections, such as fluconazole, itraconazole Rifampin This list may not describe all possible interactions. Give your health care provider a list of all the medicines, herbs, non-prescription drugs, or dietary supplements you use. Also tell them if you smoke, drink alcohol, or use illegal drugs. Some items may interact with your medicine. What should I watch for while using this medication? Visit your care team for regular checks on your progress. Tell your care team if your symptoms do not start to get better or if they get worse. What side effects may I notice from receiving this medication? Side effects that you should report to your care team as soon as possible: Allergic reactions--skin rash, itching, hives, swelling of the face, lips, tongue, or throat Side effects that usually do not require medical attention (report to your care team if they continue or are bothersome): Nausea Stomach pain This list may not describe all possible side effects. Call your doctor for medical advice about side effects. You may report side effects to FDA at 1-800-FDA-1088. Where should I keep my medication? Keep out of the reach of children and pets. Store at room temperature between 20 and 25 degrees C (68 and 77 degrees F). Get rid  of any unused medication after the expiration date. To get rid of medications that are no longer needed or have expired: Take the medication to a medication take-back program. Check with your pharmacy or law enforcement  to find a location. If you cannot return the medication, check the label or package insert to see if the medication should be thrown out in the garbage or flushed down the toilet. If you are not sure, ask your care team. If it is safe to put it in the trash, take the medication out of the container. Mix the medication with cat litter, dirt, coffee grounds, or other unwanted substance. Seal the mixture in a bag or container. Put it in the trash. NOTE: This sheet is a summary. It may not cover all possible information. If you have questions about this medicine, talk to your doctor, pharmacist, or health care provider.  2023 Elsevier/Gold Standard (2022-01-05 00:00:00)

## 2022-05-25 NOTE — Progress Notes (Signed)
[facility]    Provider:  Dr Lucia Gaskins Requesting Provider: Lexine Baton Primary Care Provider:  Lexine Baton  CC:  migraines  HPI:  Beverly Norris is a 26 y.o. female here as requested by Jake Shark, PA-C for migraines.Her father has migraines. She started having them at the age of 69. They have fluctuated over the years, the pill made it worse, she did well during pregnancy. She has mirena. If she can catch them early, it is better. Can start in the back of the head and behind the eyes, pulsating/throbbing, photo/phonophobia, nausea, hurts to move, vomiting. The migraines can be severe. If she catches the migraines early they can be terminated, if not can last up to 48 hours, sleep can be a trigger she works in the [employer] and when she works at night it can be worse. When the weather changes can be a trigger. Excercising helps. Excedrin helps. 6 migraine days a month and <10 total headache days a month. She had a CT head in 2017 reportedly normal. Not positional, no vision changes, not exertional, no changes in quality recently. No other focal neurologic deficits, associated symptoms, inciting events or modifiable factors.  Reviewed notes, labs and imaging from outside physicians, which showed:  Medications tried include: tylenol, stadol, benadryl, ibuprofen, zofran, imitrex, maxalt. Topiramate(cognitive changes), propranolol(hypotension),amitriptyline(sedation).  Review of Systems: Patient complains of symptoms per HPI as well as the following symptoms migraines. Pertinent negatives and positives per HPI. All others negative.   Social History   Socioeconomic History   Marital status: Media planner    Spouse name: Not on file   Number of children: Not on file   Years of education: Not on file   Highest education level: Not on file  Occupational History   Not on file  Tobacco Use   Smoking status: Never   Smokeless tobacco:  Never  Vaping Use   Vaping Use: Never used  Substance and Sexual Activity   Alcohol use: Yes    Comment: occasional    Drug use: Never   Sexual activity: Yes    Birth control/protection: I.U.D.  Other Topics Concern   Not on file  Social History Narrative   Not on file   Social Determinants of Health   Financial Resource Strain: Low Risk  (12/28/2018)   Overall Financial Resource Strain (CARDIA)    Difficulty of Paying Living Expenses: Not hard at all  Food Insecurity: No Food Insecurity (12/28/2018)   Hunger Vital Sign    Worried About Running Out of Food in the Last Year: Never true    Ran Out of Food in the Last Year: Never true  Transportation Needs: Unknown (12/28/2018)   PRAPARE - Administrator, Civil Service (Medical): No    Lack of Transportation (Non-Medical): Not on file  Physical Activity: Not on file  Stress: No Stress Concern Present (12/28/2018)   [employer] of Occupational Health - Occupational Stress Questionnaire    Feeling of Stress : Only a little  Social Connections: Not on file  Intimate Partner Violence: Not At Risk (12/28/2018)   Humiliation, Afraid, Rape, and Kick questionnaire    Fear of Current or Ex-Partner: No    Emotionally Abused: No    Physically Abused: No    Sexually Abused: No    Family History  Problem Relation Age of Onset   Diabetes Mother    Migraines Father    Diabetes Maternal Grandmother  Stroke Maternal Grandmother    Hypertension Maternal Grandmother    Hyperlipidemia Maternal Grandmother    Diabetes Maternal Grandfather    Kidney disease Maternal Grandfather    Heart attack Maternal Grandfather    Hypertension Maternal Grandfather    Hyperlipidemia Maternal Grandfather    Diabetes Paternal Grandmother    Diabetes Paternal Grandfather    Hypertension Paternal Grandfather    Hyperlipidemia Paternal Grandfather    Colon cancer Neg Hx    Breast cancer Neg Hx     Past Medical History:  Diagnosis Date    Closed displaced fracture of neck of right fourth metacarpal bone    COVID 07/17/2020   body aches chills loss of taste and smell x 5 days all symptoms reolved   Wears glasses     Patient Active Problem List   Diagnosis Date Noted   Family history of hyperlipidemia 04/08/2022   Body mass index (BMI) of 28.0-28.9 in adult 04/08/2022   Migraine without aura and without status migrainosus, not intractable 03/09/2021   Chronic left-sided thoracic back pain 02/12/2021   Chronic nonintractable headache 02/12/2021   History of UTI 02/12/2021   Supervision of low-risk first pregnancy, third trimester 12/31/2018    Past Surgical History:  Procedure Laterality Date   WISDOM TOOTH EXTRACTION  2015    Current Outpatient Medications  Medication Sig Dispense Refill   acetaminophen (TYLENOL) 500 MG tablet Take 500-1,000 mg by mouth every 6 (six) hours as needed for mild pain or headache.     aspirin-acetaminophen-caffeine (EXCEDRIN MIGRAINE) 250-250-65 MG tablet Take 1 tablet by mouth every 6 (six) hours as needed for headache or migraine.     EPINEPHrine 0.3 mg/0.3 mL IJ SOAJ injection Inject 0.3 mg into the muscle as needed for anaphylaxis. 2 each 0   levonorgestrel (MIRENA, 52 MG,) 20 MCG/24HR IUD 1 each by Intrauterine route See admin instructions. Place 1 new device (after removal of former one) up to every 5 years     Rimegepant Sulfate (NURTEC) 75 MG TBDP Take 75 mg by mouth daily as needed. To use acutely, take as close to onset of migraine as possible. May also take every other day as a preventative. One daily maximum. 2 tablet 0   Rimegepant Sulfate (NURTEC) 75 MG TBDP Take 75 mg by mouth daily as needed. For migraines. Take as close to onset of migraine as possible. One daily maximum. 16 tablet 11   rizatriptan (MAXALT-MLT) 5 MG disintegrating tablet Take 1 tablet (5 mg total) by mouth as needed for migraine. Take 1 tablet by mouth at onset of migraine headache. May repeat in 2 hours if  needed. Maxiumum dose is 20 mg per 24 hours. 20 tablet 1   No current facility-administered medications for this visit.    Allergies as of 05/25/2022 - Review Complete 05/25/2022  Allergen Reaction Noted   Other Anaphylaxis, Swelling, and Other (See Comments) 06/11/2021   Yellow jacket venom Anaphylaxis and Swelling 06/11/2021    Vitals: BP 127/74   Pulse 61   Ht 5\' 4"  (1.626 m)   Wt 164 lb 9.6 oz (74.7 kg)   BMI 28.25 kg/m  Last Weight:  Wt Readings from Last 1 Encounters:  05/25/22 164 lb 9.6 oz (74.7 kg)   Last Height:   Ht Readings from Last 1 Encounters:  05/25/22 5\' 4"  (1.626 m)     Physical exam: Exam: Gen: NAD, conversant, well nourised, well groomed  CV: RRR, no MRG. No Carotid Bruits. No peripheral edema, warm, nontender Eyes: Conjunctivae clear without exudates or hemorrhage  Neuro: Detailed Neurologic Exam  Speech:    Speech is normal; fluent and spontaneous with normal comprehension.  Cognition:    The patient is oriented to person, place, and time;     recent and remote memory intact;     language fluent;     normal attention, concentration,     fund of knowledge Cranial Nerves:    The pupils are equal, round, and reactive to light. The fundi are normal and spontaneous venous pulsations are present. Visual fields are full to finger confrontation. Extraocular movements are intact. Trigeminal sensation is intact and the muscles of mastication are normal. The face is symmetric. The palate elevates in the midline. Hearing intact. Voice is normal. Shoulder shrug is normal. The tongue has normal motion without fasciculations.   Coordination:    Normal   Gait:     normal.   Motor Observation:    No asymmetry, no atrophy, and no involuntary movements noted. Tone:    Normal muscle tone.    Posture:    Posture is normal. normal erect    Strength:    Strength is V/V in the upper and lower limbs.      Sensation: intact to LT      Reflex Exam:  DTR's:    Deep tendon reflexes in the upper and lower extremities are normal bilaterally.   Toes:    The toes are downgoing bilaterally.   Clonus:    Clonus is absent.    Assessment/Plan:  Patient with episodic migraines. We had a discussion of the newer migraine medication.  Nurtec: Can take it as needed once daily or every other day for prevention. Some people take it M-W-F and use the extra tablets (you get 16) as needed. May take this with a triptan or ondansetron.6 migraine days a month and <10 total headache days a month. Medications tried include: tylenol, stadol, benadryl, ibuprofen, zofran, imitrex, maxalt. Topiramate(cognitive changes), propranolol(hypotension),amitriptyline(sedation).  Discussed: Newer medications for acute management: Nurtec, Ubrelvy Newer medications for prevention include: Ajovy, Emgality (injections once monthly) or Qulipta (daily pill). Also Aimovig but this has more side effects than Ajovy/Emgality. Still, the side effect profiles for the newer medications are great. Another option includes: botox for migraines No indication for imaging at this time  To prevent or relieve headaches, try the following: Cool Compress. Lie down and place a cool compress on your head.  Avoid headache triggers. If certain foods or odors seem to have triggered your migraines in the past, avoid them. A headache diary might help you identify triggers.  Include physical activity in your daily routine. Try a daily walk or other moderate aerobic exercise.  Manage stress. Find healthy ways to cope with the stressors, such as delegating tasks on your to-do list.  Practice relaxation techniques. Try deep breathing, yoga, massage and visualization.  Eat regularly. Eating regularly scheduled meals and maintaining a healthy diet might help prevent headaches. Also, drink plenty of fluids.  Follow a regular sleep schedule. Sleep deprivation might contribute to  headaches Consider biofeedback. With this mind-body technique, you learn to control certain bodily functions -- such as muscle tension, heart rate and blood pressure -- to prevent headaches or reduce headache pain.    Proceed to emergency room if you experience new or worsening symptoms or symptoms do not resolve, if you have new neurologic symptoms or if headache is severe,  or for any concerning symptom.   Provided education and documentation from American headache Society toolbox including articles on: chronic migraine medication overuse headache, chronic migraines, prevention of migraines, behavioral and other nonpharmacologic treatments for headache.  Meds ordered this encounter  Medications   Rimegepant Sulfate (NURTEC) 75 MG TBDP    Sig: Take 75 mg by mouth daily as needed. To use acutely, take as close to onset of migraine as possible. May also take every other day as a preventative. One daily maximum.    Dispense:  2 tablet    Refill:  0   Rimegepant Sulfate (NURTEC) 75 MG TBDP    Sig: Take 75 mg by mouth daily as needed. For migraines. Take as close to onset of migraine as possible. One daily maximum.    Dispense:  16 tablet    Refill:  11    Episodic migraines. 6 migraines/mo and <10 total headache days/mo. Meds tried include: imitrex, maxalt, zomig, ubrelvy, Topiramate(cognitive changes), propranolol(hypotension),amitriptyline(sedation).    Cc: Rosaura Carpenter, PA-C  Sarina Ill, MD  [facility] [address] [place], [place] [address]  Phone [phone] Fax [phone]  I spent over 45 minutes of face-to-face and non-face-to-face time with patient on the  1. Migraine without aura and without status migrainosus, not intractable    diagnosis.  This included previsit chart review, lab review, study review, order entry, electronic health record documentation, patient education on the different diagnostic and  therapeutic options, counseling and coordination of care, risks and benefits of management, compliance, or risk factor reduction

## 2022-05-27 ENCOUNTER — Encounter: Payer: Self-pay | Admitting: Internal Medicine

## 2022-05-28 DIAGNOSIS — Z419 Encounter for procedure for purposes other than remedying health state, unspecified: Secondary | ICD-10-CM | POA: Diagnosis not present

## 2022-06-01 ENCOUNTER — Telehealth: Payer: Self-pay | Admitting: *Deleted

## 2022-06-01 NOTE — Telephone Encounter (Signed)
Completed Nurtec PA that was received via [employer] provide portal. KEY code: [id number]. Waiting on determination from insurance.

## 2022-06-02 NOTE — Telephone Encounter (Signed)
Received approval from [facility]. Nurtec has been approved from 05/02/22 - 09/23/22. Approval letter faxed to [facility]. Received a receipt of confirmation.

## 2022-06-28 DIAGNOSIS — Z419 Encounter for procedure for purposes other than remedying health state, unspecified: Secondary | ICD-10-CM | POA: Diagnosis not present

## 2022-07-27 DIAGNOSIS — Z01419 Encounter for gynecological examination (general) (routine) without abnormal findings: Secondary | ICD-10-CM | POA: Diagnosis not present

## 2022-07-29 DIAGNOSIS — Z419 Encounter for procedure for purposes other than remedying health state, unspecified: Secondary | ICD-10-CM | POA: Diagnosis not present

## 2022-08-03 ENCOUNTER — Encounter: Payer: Self-pay | Admitting: Internal Medicine

## 2022-08-18 DIAGNOSIS — L814 Other melanin hyperpigmentation: Secondary | ICD-10-CM | POA: Diagnosis not present

## 2022-08-18 DIAGNOSIS — D2272 Melanocytic nevi of left lower limb, including hip: Secondary | ICD-10-CM | POA: Diagnosis not present

## 2022-08-18 DIAGNOSIS — D229 Melanocytic nevi, unspecified: Secondary | ICD-10-CM | POA: Diagnosis not present

## 2022-08-18 DIAGNOSIS — X32XXXS Exposure to sunlight, sequela: Secondary | ICD-10-CM | POA: Diagnosis not present

## 2022-08-28 DIAGNOSIS — Z419 Encounter for procedure for purposes other than remedying health state, unspecified: Secondary | ICD-10-CM | POA: Diagnosis not present

## 2022-09-06 ENCOUNTER — Encounter: Payer: Self-pay | Admitting: Internal Medicine

## 2022-09-26 ENCOUNTER — Telehealth: Payer: BC Managed Care – PPO | Admitting: Neurology

## 2022-09-26 ENCOUNTER — Encounter: Payer: Self-pay | Admitting: Neurology

## 2022-09-28 DIAGNOSIS — Z419 Encounter for procedure for purposes other than remedying health state, unspecified: Secondary | ICD-10-CM | POA: Diagnosis not present

## 2022-09-29 NOTE — Telephone Encounter (Signed)
Called pt to reschedule appointment no answer. Vm was full and couldn't leave a message.

## 2022-10-11 ENCOUNTER — Encounter: Payer: Self-pay | Admitting: Internal Medicine

## 2022-10-28 DIAGNOSIS — Z419 Encounter for procedure for purposes other than remedying health state, unspecified: Secondary | ICD-10-CM | POA: Diagnosis not present

## 2022-11-03 ENCOUNTER — Ambulatory Visit (INDEPENDENT_AMBULATORY_CARE_PROVIDER_SITE_OTHER): Payer: BC Managed Care – PPO | Admitting: Nurse Practitioner

## 2022-11-03 ENCOUNTER — Encounter: Payer: Self-pay | Admitting: Nurse Practitioner

## 2022-11-03 VITALS — BP 120/74 | HR 64 | Temp 98.5°F | Wt 169.2 lb

## 2022-11-03 DIAGNOSIS — R41840 Attention and concentration deficit: Secondary | ICD-10-CM | POA: Diagnosis not present

## 2022-11-03 DIAGNOSIS — Z87440 Personal history of urinary (tract) infections: Secondary | ICD-10-CM | POA: Diagnosis not present

## 2022-11-03 DIAGNOSIS — F419 Anxiety disorder, unspecified: Secondary | ICD-10-CM | POA: Diagnosis not present

## 2022-11-03 MED ORDER — AMPHETAMINE-DEXTROAMPHETAMINE 10 MG PO TABS: 10.0000 mg | ORAL_TABLET | Freq: Two times a day (BID) | ORAL | 0 refills | Status: DC

## 2022-11-03 NOTE — Assessment & Plan Note (Signed)
ADHD symptoms checklist positive for attention and focus deficits with a history of the same. Given her current symptoms and family history, I do feel it is reasonable to trial adderall IR 10mg  up to twice a day dosing. We discussed the option of splitting the dose in the morning and in the afternoon if needed. If she feels that a higher dose is needed, she will reach out for adjustment. I do not have to see her for this. We will plan to follow-up in 3 months unless she is in need of evaluation sooner.

## 2022-11-03 NOTE — Patient Instructions (Addendum)
It was a pleasure meeting you today.   I have sent a referral to urology for you to check on your frequent UTI's and other symptoms you are having. They will get in touch with you in the near future. If you have not heard from them in about 10 days, please let us know.   I have sent in the adderall for you to try. If you need an adjustment please let me know. If you are doing well, we can touch base in about 3 months and as long as you are doing well we will plan for every 6 months from then on.   If you feel like you anxiety is not better once starting the adderall, please let me know.

## 2022-11-03 NOTE — Progress Notes (Signed)
Shawna Clamp, DNP, AGNP-c [facility]  [address] [place], [place] [address] [phone]  ESTABLISHED PATIENT- Chronic Health and/or Follow-Up Visit  Blood pressure 120/74, pulse 64, temperature 98.5 F (36.9 C), weight 169 lb 3.2 oz (76.7 kg).    Beverly Norris is a 26 y.o. year old female presenting today for evaluation and management of the following: Frequent UTI Having recurrent and frequent UTIs with incontinent episodes in between. She feels like her body isn't telling her she has to urinate until she already has started. She is able to hold her urine after she initially releases a small amount. She has been doing her kegal exercises regularly, but does not feel that much of a difference has been made. She feels these symptoms have been ongoing since having her son.   ADHD Harini has not been diagnosed with ADHD in the past, but tells me that she has concerns with this diagnosis due to her symptoms. She also has a strong family history in her brother, mother, and father. She feels like she has always had symptoms of difficulty concentrating. She has a history of interrupting, cannot get her thoughts fast enough, and no concept of time. She tells me she gets really side tracked at work and forgets to complete things. She tells me she has a hard time articulating her feelings and her brain is going 1000 miles per hour. She did well in school with her grades overall, but she often did not pay close attention or she would blurt out a lot, she wasn't able to stay in her seat.   Anxiety She has a history of anxiety that started around the time of pregnancy. Feels this is related to her ADHD. She has previously been on sertraline and this was helpful. She denies SI/HI. She is hopeful with controlled attention her anxiety will also improve.     All ROS negative with exception of what is listed above.   PHYSICAL EXAM Physical Exam Vitals and nursing note  reviewed.  Constitutional:      Appearance: Normal appearance.  HENT:     Head: Normocephalic.  Eyes:     Pupils: Pupils are equal, round, and reactive to light.  Neck:     Vascular: No carotid bruit.  Cardiovascular:     Rate and Rhythm: Normal rate and regular rhythm.     Pulses: Normal pulses.     Heart sounds: Normal heart sounds.  Pulmonary:     Effort: Pulmonary effort is normal.     Breath sounds: Normal breath sounds.  Abdominal:     General: There is no distension.     Tenderness: There is no abdominal tenderness.  Musculoskeletal:        General: Normal range of motion.     Cervical back: Normal range of motion.  Lymphadenopathy:     Cervical: No cervical adenopathy.  Skin:    General: Skin is warm and dry.     Capillary Refill: Capillary refill takes less than 2 seconds.  Neurological:     General: No focal deficit present.     Mental Status: She is alert and oriented to person, place, and time.  Psychiatric:        Mood and Affect: Mood normal.        Behavior: Behavior normal.        Thought Content: Thought content normal.        Judgment: Judgment normal.     PLAN Problem List Items Addressed This  Visit     History of UTI - Primary    Frequent uti with symptoms of incontinence and urgency chronically. At this time unclear if this is related to pelvic floor weakness or possible urological concern. We discussed option of pelvic PT vs urology evaluation. At this time she would like to have evaluation with urology. Will send referral today.       Relevant Orders   Ambulatory referral to Urology   Attention and concentration deficit    ADHD symptoms checklist positive for attention and focus deficits with a history of the same. Given her current symptoms and family history, I do feel it is reasonable to trial adderall IR 10mg  up to twice a day dosing. We discussed the option of splitting the dose in the morning and in the afternoon if needed. If she feels  that a higher dose is needed, she will reach out for adjustment. I do not have to see her for this. We will plan to follow-up in 3 months unless she is in need of evaluation sooner.       Relevant Medications   amphetamine-dextroamphetamine (ADDERALL) 10 MG tablet   amphetamine-dextroamphetamine (ADDERALL) 10 MG tablet (Start on 12/01/2022)   amphetamine-dextroamphetamine (ADDERALL) 10 MG tablet (Start on 12/29/2022)   Anxiety disorder    Anxiety, unspecified, likely related to uncontrolled ADHD and difficulty managing daily tasks and routine. We discussed the option to restart sertraline vs trial ADHD treatment and restart sertraline if symptoms are not improved with control of ADHD. She would like to do the latter at this time. We will plan to follow-up in 3 months or sooner if she feels that adding the sertraline is needed. Given that we discussed this today, unless her symptoms are worse, I am ok with sending in sertraline 50mg  qHS if she feels this is needed and following up 4 weeks after start.        Return in about 3 months (around 02/02/2023) for virtual check-in ADHD.   , DNP, AGNP-c 11/03/2022  9:07 AM

## 2022-11-03 NOTE — Assessment & Plan Note (Signed)
Frequent uti with symptoms of incontinence and urgency chronically. At this time unclear if this is related to pelvic floor weakness or possible urological concern. We discussed option of pelvic PT vs urology evaluation. At this time she would like to have evaluation with urology. Will send referral today.

## 2022-11-03 NOTE — Assessment & Plan Note (Signed)
Anxiety, unspecified, likely related to uncontrolled ADHD and difficulty managing daily tasks and routine. We discussed the option to restart sertraline vs trial ADHD treatment and restart sertraline if symptoms are not improved with control of ADHD. She would like to do the latter at this time. We will plan to follow-up in 3 months or sooner if she feels that adding the sertraline is needed. Given that we discussed this today, unless her symptoms are worse, I am ok with sending in sertraline 50mg  qHS if she feels this is needed and following up 4 weeks after start.

## 2022-11-14 ENCOUNTER — Encounter: Payer: Self-pay | Admitting: Nurse Practitioner

## 2022-11-14 DIAGNOSIS — F320 Major depressive disorder, single episode, mild: Secondary | ICD-10-CM

## 2022-11-14 DIAGNOSIS — F411 Generalized anxiety disorder: Secondary | ICD-10-CM

## 2022-11-15 MED ORDER — SERTRALINE HCL 50 MG PO TABS: 50.0000 mg | ORAL_TABLET | Freq: Every day | ORAL | 3 refills | Status: DC

## 2022-11-28 DIAGNOSIS — Z419 Encounter for procedure for purposes other than remedying health state, unspecified: Secondary | ICD-10-CM | POA: Diagnosis not present

## 2022-12-20 DIAGNOSIS — N898 Other specified noninflammatory disorders of vagina: Secondary | ICD-10-CM | POA: Diagnosis not present

## 2022-12-20 DIAGNOSIS — Z30432 Encounter for removal of intrauterine contraceptive device: Secondary | ICD-10-CM | POA: Diagnosis not present

## 2022-12-20 DIAGNOSIS — Z113 Encounter for screening for infections with a predominantly sexual mode of transmission: Secondary | ICD-10-CM | POA: Diagnosis not present

## 2022-12-29 DIAGNOSIS — Z419 Encounter for procedure for purposes other than remedying health state, unspecified: Secondary | ICD-10-CM | POA: Diagnosis not present

## 2023-01-04 ENCOUNTER — Telehealth: Payer: Self-pay | Admitting: Neurology

## 2023-01-04 ENCOUNTER — Telehealth (INDEPENDENT_AMBULATORY_CARE_PROVIDER_SITE_OTHER): Payer: Self-pay | Admitting: Neurology

## 2023-01-04 DIAGNOSIS — G43009 Migraine without aura, not intractable, without status migrainosus: Secondary | ICD-10-CM

## 2023-01-04 NOTE — Patient Instructions (Signed)
ADHD Follow-Up Patient Information Handout  Three prescriptions of your ADHD medication will be sent electronically to the pharmacy. Prescriptions can only be dispensed from the pharmacy for 30 days at a time. We are unable to provide 90 day prescriptions.  Please ensure that you have scheduled your follow-up in advance. Our office is unable to guarantee a spot at the last minute and federal law requires monitoring for any person on this type of medication.  Your provider may provide 1 emergency refill. This will only be completed if a follow-up is scheduled within 28 days of the refill date. If you cancel or miss your appointment after receiving an emergency refill, we are unable to provide you with additional refills until you have been seen.  This is a controlled substance. Your provider is required by law to monitor this medication and all other controlled substances you are prescribed. If monitoring shows you are at increased risk of harm or unintentional overdose, your prescription may be placed on hold or limited for your safety.  Your provider may choose to decline filling your prescription if they feel there is reason to believe the medication is being misused or abused.    ADHD MEDICATION GUIDELINES  By accepting this prescription and collecting the medication from the pharmacy, you agree to the following:   I will keep my medication in a safe place. We will not provide Beverly Norris refills or additional prescriptions for misplaced, lost, stolen, or unauthorized use of medication due to strict regulatory measures.   I will not take more medication than prescribed.  If you feel you need an increase on your dosage or your current medication is not working, please schedule an appointment to discuss this.  Do not change your dosage to a higher dose without discussing with your provider first.  You will not be provided additional medication or Beverly Norris fills for medication that is taken in a manner  inconsistent with the prescription. This is against the law and a punishable offense.  Taking more medication or habitually requesting Beverly Norris refills will result in forfeiture of future refills from this office.   I will not share this medication with others. This medication is a controlled substance and strictly regulated by the federal government.  Sharing this medication, for any reason, is against the law and will result in forfeiture of future refills from this office.   I will keep my follow-up appointments Follow-up appointments are required every 3 months for controlled substance refills. Please make your follow-up appointments prior to leaving the office today to ensure that you can be seen before your prescriptions run out. We will not provide any additional refills until the follow-up appointment has been completed (with RARE emergency exception). We will not provide refills for prescriptions if a follow-up appointment is not kept.  I will not take this medication for recreational purposes or to "get high".   I will not use illegal substances or medications not prescribed to me while taking this medication.  I will notify my provider if I am started on any other controlled substances by other providers while taking this medication  I agree to random drug testing to ensure that therapeutic doses of this medication are in my system and other non-prescribed medications and recreational drugs are not.  If recreational substances, non-therapeutic doses of this medication, no evidence of this medication, or other controlled substances for which you do not have a prescription are found to be positive on your drug screen, you risk  forfeiture of future refills by this office. There is no exception to this rule.

## 2023-01-04 NOTE — Progress Notes (Signed)
Running late, did not reach her

## 2023-01-04 NOTE — Progress Notes (Signed)
Virtual Visit Encounter {telephone/mychart:25033} visit.   I connected with  Beverly Norris on 01/04/23 at  8:30 AM EST by secure {Video Enabled:26378::"video and audio"} telemedicine application. I verified that I am speaking with the correct person using two identifiers.   I introduced myself as a Designer, jewellery with the practice. The limitations of evaluation and management by telemedicine discussed with the patient and the availability of in person appointments. The patient expressed verbal understanding and consent to proceed.  Participating parties in this visit include: Myself and {Relatives of adult:5061}  The patient is: {Patient [phone]::"Home"} I am: {Provider Location:[phone]::"Home Office"} Subjective:    CC and HPI: Beverly Norris is a 27 y.o. year old female presenting for {New/followup:15353} ***. Patient reports the following:  Past medical history, Surgical history, Family history not pertinant except as noted below, Social history, Allergies, and medications have been entered into the medical record, reviewed, and corrections made.   Review of Systems:  All review of systems negative except what is listed in the HPI  Objective:    Alert and oriented x *** Speaking in clear sentences with no shortness of breath. No distress.  Impression and Recommendations:    Problem List Items Addressed This Visit   None   {disease follow up plans:315751} I discussed the assessment and treatment plan with the patient. The patient was provided an opportunity to ask questions and all were answered. The patient agreed with the plan and demonstrated an understanding of the instructions.   The patient was advised to call back or seek an in-person evaluation if the symptoms worsen or if the condition fails to improve as anticipated.  Follow-Up: {plan; follow-up:11812}  I provided *** minutes of non-face-to-face interaction with this non  face-to-face encounter including intake, same-day documentation, and chart review.   Orma Render, NP , DNP, AGNP-c [facility]

## 2023-01-04 NOTE — Telephone Encounter (Signed)
Please apologize to patient, I was running late today and when I tried to connect with her she had already left.  Completely my fault.  Can you see if she would like to reschedule with myself or with an NP.  The best time to schedule where I am not running behind is usually 1 PM or 7:30 in the morning.can be video.

## 2023-01-05 ENCOUNTER — Telehealth (INDEPENDENT_AMBULATORY_CARE_PROVIDER_SITE_OTHER): Payer: BC Managed Care – PPO | Admitting: Nurse Practitioner

## 2023-01-05 ENCOUNTER — Encounter: Payer: Self-pay | Admitting: Nurse Practitioner

## 2023-01-05 VITALS — HR 60 | Ht 63.5 in | Wt 162.0 lb

## 2023-01-05 DIAGNOSIS — R41840 Attention and concentration deficit: Secondary | ICD-10-CM

## 2023-01-05 DIAGNOSIS — F320 Major depressive disorder, single episode, mild: Secondary | ICD-10-CM

## 2023-01-05 DIAGNOSIS — F411 Generalized anxiety disorder: Secondary | ICD-10-CM | POA: Diagnosis not present

## 2023-01-05 MED ORDER — SERTRALINE HCL 50 MG PO TABS: 75.0000 mg | ORAL_TABLET | Freq: Every day | ORAL | 3 refills | Status: DC

## 2023-01-05 MED ORDER — LISDEXAMFETAMINE DIMESYLATE 30 MG PO CHEW: 30.0000 mg | CHEWABLE_TABLET | Freq: Every day | ORAL | 0 refills | Status: DC

## 2023-01-05 NOTE — Assessment & Plan Note (Signed)
Patient's ADHD symptoms are well-controlled with Adderall, but experiences bouts of high blood pressure and tachycardia, possibly related to underlying anxiety. Plan: Consider switching to Vyvanse for ADHD management, as it tends to have fewer side effects. Patient's insurance covers Vyvanse. Trial for one week and obtain patient feedback to determine preference between Adderall and Vyvanse. If Vyvanse is not sufficient, consider adding a short-acting Adderall in the afternoon. Encouraged patient to reach out with any concerns or questions in the future.

## 2023-01-05 NOTE — Assessment & Plan Note (Signed)
Patient reports occasional anxiety, which seems to improve with Adderall use. Currently on Sertraline 50 mg daily. Plan: Increase Sertraline to 75 mg daily (one and a half 50 mg tablets). Reassess in two weeks to determine if further dose adjustment is needed with MyChart message.

## 2023-01-05 NOTE — Telephone Encounter (Signed)
Pt rescheduled for vv with Dr. Jaynee Eagles for 01/24/23 at 11:00am

## 2023-01-09 ENCOUNTER — Encounter: Payer: Self-pay | Admitting: Nurse Practitioner

## 2023-01-09 ENCOUNTER — Telehealth: Payer: Self-pay

## 2023-01-09 DIAGNOSIS — R41840 Attention and concentration deficit: Secondary | ICD-10-CM

## 2023-01-09 NOTE — Telephone Encounter (Signed)
Pt. Sent mychart message stating Vyvanse wasn't be filled at her pharmacy. I checked with her pharmacy and they said it needed a PA.

## 2023-01-14 ENCOUNTER — Telehealth: Payer: Self-pay | Admitting: Nurse Practitioner

## 2023-01-14 NOTE — Telephone Encounter (Signed)
Received Beverly Norris, Pt has 2 insurances Medicaid only pays for [place] approved for generic But now Per [facility] neither is available.   Do you want to switch to a different medication ?

## 2023-01-18 NOTE — Telephone Encounter (Signed)
Please call the patient and let her know that [facility] has informed us that Vyvanse is not available in any form currently and they do not know when they will have this in stock again. Our options are to try a different medication or hold the medication until the supply improves.   Please ask what other adhd medications she has tried and if any of them have worked for her in the past aside from vyvanse.  If she has not tried anything, I recommend Adderall as it is closest to Vyvanse.

## 2023-01-20 MED ORDER — AMPHETAMINE-DEXTROAMPHET ER 15 MG PO CP24: 15.0000 mg | ORAL_CAPSULE | ORAL | 0 refills | Status: DC

## 2023-01-24 ENCOUNTER — Telehealth: Payer: Self-pay | Admitting: Neurology

## 2023-01-24 ENCOUNTER — Telehealth (INDEPENDENT_AMBULATORY_CARE_PROVIDER_SITE_OTHER): Payer: BC Managed Care – PPO | Admitting: Neurology

## 2023-01-24 DIAGNOSIS — G43009 Migraine without aura, not intractable, without status migrainosus: Secondary | ICD-10-CM | POA: Diagnosis not present

## 2023-01-24 MED ORDER — QULIPTA 60 MG PO TABS: 60.0000 mg | ORAL_TABLET | Freq: Every day | ORAL | 6 refills | Status: DC

## 2023-01-24 MED ORDER — UBRELVY 100 MG PO TABS: 100.0000 mg | ORAL_TABLET | ORAL | 11 refills | Status: DC | PRN

## 2023-01-24 MED ORDER — ONDANSETRON 4 MG PO TBDP: 4.0000 mg | ORAL_TABLET | Freq: Three times a day (TID) | ORAL | 3 refills | Status: DC | PRN

## 2023-01-24 NOTE — Progress Notes (Unsigned)
[facility]    Provider:  Dr Jaynee Eagles Requesting Provider: Marcellina Millin Primary Care Provider:  Orma Render, NP  Virtual Visit via Video Note  I connected with Beverly Norris on 01/26/23 at 11:00 AM EST by a video enabled telemedicine application and verified that I am speaking with the correct person using two identifiers.  Location: Patient: home Provider: office   I discussed the limitations of evaluation and management by telemedicine and the availability of in person appointments. The patient expressed understanding and agreed to proceed.  History of Present Illness:    Follow Up Instructions:    I discussed the assessment and treatment plan with the patient. The patient was provided an opportunity to ask questions and all were answered. The patient agreed with the plan and demonstrated an understanding of the instructions.   The patient was advised to call back or seek an in-person evaluation if the symptoms worsen or if the condition fails to improve as anticipated.  I provided 30 minutes of non-face-to-face time during this encounter.   Melvenia Beam, MD  CC:  migraines  01/24/2023: Nurtec too expensive but works, let's try [facility]. She is taking ibuprofen it is getting worse over the last > 3 months having 6 moderate to severe migraine days a month and <10 total headache days a month, nausea, emesis, cold sweats, severe. We discussed options, decided on [place].   Patient complains of symptoms per HPI as well as the following symptoms: migraine . Pertinent negatives and positives per HPI. All others negative  HPI:  Beverly Norris is a 27 y.o. female here as requested by Irene Pap, PA-C for migraines.Her father has migraines. She started having them at the age of 66. They have fluctuated over the years, the pill made it worse, she did well during pregnancy. She has mirena. If she can  catch them early, it is better. Can start in the back of the head and behind the eyes, pulsating/throbbing, photo/phonophobia, nausea, hurts to move, vomiting. The migraines can be severe. If she catches the migraines early they can be terminated, if not can last up to 48 hours, sleep can be a trigger she works in the [employer] and when she works at night it can be worse. When the weather changes can be a trigger. Excercising helps. Excedrin helps. 6 migraine days a month and <10 total headache days a month. She had a CT head in 2017 reportedly normal. Not positional, no vision changes, not exertional, no changes in quality recently. No other focal neurologic deficits, associated symptoms, inciting events or modifiable factors.  Reviewed notes, labs and imaging from outside physicians, which showed:  Medications tried include: tylenol, stadol, benadryl, ibuprofen, zofran, imitrex, maxalt. Topiramate(cognitive changes), propranolol(hypotension),amitriptyline(sedation).  Review of Systems: Patient complains of symptoms per HPI as well as the following symptoms migraines. Pertinent negatives and positives per HPI. All others negative.   Social History   Socioeconomic History   Marital status: Soil scientist    Spouse name: Not on file   Number of children: Not on file   Years of education: Not on file   Highest education level: Not on file  Occupational History   Not on file  Tobacco Use   Smoking status: Never   Smokeless tobacco: Never  Vaping Use   Vaping Use: Never used  Substance and Sexual Activity   Alcohol use: Yes    Comment: occasional    Drug  use: Never   Sexual activity: Yes    Birth control/protection: I.U.D.  Other Topics Concern   Not on file  Social History Narrative   Not on file   Social Determinants of Health   Financial Resource Strain: Low Risk  (12/28/2018)   Overall Financial Resource Strain (CARDIA)    Difficulty of Paying Living Expenses: Not hard at all   Food Insecurity: No Food Insecurity (12/28/2018)   Hunger Vital Sign    Worried About Running Out of Food in the Last Year: Never true    Ran Out of Food in the Last Year: Never true  Transportation Needs: Unknown (12/28/2018)   PRAPARE - Hydrologist (Medical): No    Lack of Transportation (Non-Medical): Not on file  Physical Activity: Not on file  Stress: No Stress Concern Present (12/28/2018)   [place]    Feeling of Stress : Only a little  Social Connections: Not on file  Intimate Partner Violence: Not At Risk (12/28/2018)   Humiliation, Afraid, Rape, and Kick questionnaire    Fear of Current or Ex-Partner: No    Emotionally Abused: No    Physically Abused: No    Sexually Abused: No    Family History  Problem Relation Age of Onset   Diabetes Mother    Migraines Father    Diabetes Maternal Grandmother    Stroke Maternal Grandmother    Hypertension Maternal Grandmother    Hyperlipidemia Maternal Grandmother    Diabetes Maternal Grandfather    Kidney disease Maternal Grandfather    Heart attack Maternal Grandfather    Hypertension Maternal Grandfather    Hyperlipidemia Maternal Grandfather    Diabetes Paternal Grandmother    Diabetes Paternal Grandfather    Hypertension Paternal Grandfather    Hyperlipidemia Paternal Grandfather    Colon cancer Neg Hx    Breast cancer Neg Hx     Past Medical History:  Diagnosis Date   Closed displaced fracture of neck of right fourth metacarpal bone    COVID 07/17/2020   body aches chills loss of taste and smell x 5 days all symptoms reolved   Wears glasses     Patient Active Problem List   Diagnosis Date Noted   Attention and concentration deficit 11/03/2022   Anxiety disorder 11/03/2022   Family history of hyperlipidemia 04/08/2022   Body mass index (BMI) of 28.0-28.9 in adult 04/08/2022   Migraine without aura and without status  migrainosus, not intractable 03/09/2021   Chronic left-sided thoracic back pain 02/12/2021   Chronic nonintractable headache 02/12/2021   History of UTI 02/12/2021   Supervision of low-risk first pregnancy, third trimester 12/31/2018    Past Surgical History:  Procedure Laterality Date   WISDOM TOOTH EXTRACTION  2015    Current Outpatient Medications  Medication Sig Dispense Refill   amphetamine-dextroamphetamine (ADDERALL XR) 15 MG 24 hr capsule Take 1 capsule by mouth every morning. 30 capsule 0   Atogepant (QULIPTA) 60 MG TABS Take 1 tablet (60 mg total) by mouth daily. 30 tablet 6   ondansetron (ZOFRAN-ODT) 4 MG disintegrating tablet Take 1-2 tablets (4-8 mg total) by mouth every 8 (eight) hours as needed. 30 tablet 3   Ubrogepant (UBRELVY) 100 MG TABS Take 1 tablet (100 mg total) by mouth every 2 (two) hours as needed. Maximum '200mg'$  a day. 16 tablet 11   acetaminophen (TYLENOL) 500 MG tablet Take 500-1,000 mg by mouth every 6 (six) hours  as needed for mild pain or headache.     aspirin-acetaminophen-caffeine (EXCEDRIN MIGRAINE) 250-250-65 MG tablet Take 1 tablet by mouth every 6 (six) hours as needed for headache or migraine.     lisdexamfetamine (VYVANSE) 30 MG chewable tablet Chew 1 tablet (30 mg total) by mouth daily. 30 tablet 0   sertraline (ZOLOFT) 50 MG tablet Take 1.5 tablets (75 mg total) by mouth at bedtime. For anxiety. 135 tablet 3   No current facility-administered medications for this visit.    Allergies as of 01/24/2023 - Review Complete 01/05/2023  Allergen Reaction Noted   Bee venom Anaphylaxis 01/04/2023   Other Anaphylaxis, Swelling, and Other (See Comments) 06/11/2021   Yellow jacket venom Anaphylaxis and Swelling 06/11/2021    Vitals: There were no vitals taken for this visit. Last Weight:  Wt Readings from Last 1 Encounters:  01/05/23 162 lb (73.5 kg)   Last Height:   Ht Readings from Last 1 Encounters:  01/05/23 5' 3.5" (1.613 m)    Physical  exam: Exam: Gen: conversant      CV: attempted, Could not perform over Web Video. Denies palpitations or chest pain or SOB. VS: Breathing at a normal rate.. Not febrile Eyes: Conjunctivae clear without exudates or hemorrhage  Neuro: Detailed Neurologic Exam  Speech:    Speech is normal; fluent and spontaneous with normal comprehension.  Cognition:    The patient is oriented to person, place, and time;     recent and remote memory intact;     language fluent;     normal attention, concentration,     fund of knowledge Cranial Nerves:    The pupils are equal, round, and reactive to light. Cannot perform fundoscopic exam. Visual fields are full to finger confrontation. Extraocular movements are intact.  The face is symmetric with normal sensation. The palate elevates in the midline. Hearing intact. Voice is normal. Shoulder shrug is normal. The tongue has normal motion without fasciculations.   Coordination:    Normal finger to nose  Gait:    Normal native gait  Motor Observation:   no involuntary movements noted. Tone:    Appears normal  Posture:    Posture is normal. normal erect    Strength:    Strength is anti-gravity and symmetric in the upper and lower limbs.      Sensation: intact to LT           Assessment/Plan:  Patient with episodic migraines. We had a discussion of the newer migraine medication.   Nurtec too expensive but works, let's try [facility]. She is taking ibuprofen it is getting worse over the last > 3 months having 6 moderate to severe migraine days a month and <10 total headache days a month, nausea, emesis, cold sweats, severe. We discussed options, decided on [place].   Meds ordered this encounter  Medications   Atogepant (QULIPTA) 60 MG TABS    Sig: Take 1 tablet (60 mg total) by mouth daily.    Dispense:  30 tablet    Refill:  6    6 migraine days a month and <10 total headache days a month. Tried: tylenol, ibuprofen,  zofran, imitrex, nurtec, maxalt. Topiramate(cognitive changes), propranolol(hypotension),amitriptyline(sedation).   Ubrogepant (UBRELVY) 100 MG TABS    Sig: Take 1 tablet (100 mg total) by mouth every 2 (two) hours as needed. Maximum '200mg'$  a day.    Dispense:  16 tablet    Refill:  11    6 migraine days  a month and <10 total headache days a month. Tried: tylenol, ibuprofen, zofran, imitrex, nurtec, maxalt. Topiramate(cognitive changes), propranolol(hypotension),amitriptyline(sedation).   ondansetron (ZOFRAN-ODT) 4 MG disintegrating tablet    Sig: Take 1-2 tablets (4-8 mg total) by mouth every 8 (eight) hours as needed.    Dispense:  30 tablet    Refill:  3     Discussed: Newer medications for acute management: Nurtec, Ubrelvy Newer medications for prevention include: Ajovy, Emgality (injections once monthly) or Qulipta (daily pill). Also Aimovig but this has more side effects than Ajovy/Emgality. Still, the side effect profiles for the newer medications are great. Another option includes: botox for migraines No indication for imaging at this time  To prevent or relieve headaches, try the following: Cool Compress. Lie down and place a cool compress on your head.  Avoid headache triggers. If certain foods or odors seem to have triggered your migraines in the past, avoid them. A headache diary might help you identify triggers.  Include physical activity in your daily routine. Try a daily walk or other moderate aerobic exercise.  Manage stress. Find healthy ways to cope with the stressors, such as delegating tasks on your to-do list.  Practice relaxation techniques. Try deep breathing, yoga, massage and visualization.  Eat regularly. Eating regularly scheduled meals and maintaining a healthy diet might help prevent headaches. Also, drink plenty of fluids.  Follow a regular sleep schedule. Sleep deprivation might contribute to headaches Consider biofeedback. With this mind-body technique, you  learn to control certain bodily functions -- such as muscle tension, heart rate and blood pressure -- to prevent headaches or reduce headache pain.    Proceed to emergency room if you experience new or worsening symptoms or symptoms do not resolve, if you have new neurologic symptoms or if headache is severe, or for any concerning symptom.   Provided education and documentation from American headache Society toolbox including articles on: chronic migraine medication overuse headache, chronic migraines, prevention of migraines, behavioral and other nonpharmacologic treatments for headache.  Meds ordered this encounter  Medications   Atogepant (QULIPTA) 60 MG TABS    Sig: Take 1 tablet (60 mg total) by mouth daily.    Dispense:  30 tablet    Refill:  6    6 migraine days a month and <10 total headache days a month. Tried: tylenol, ibuprofen, zofran, imitrex, nurtec, maxalt. Topiramate(cognitive changes), propranolol(hypotension),amitriptyline(sedation).   Ubrogepant (UBRELVY) 100 MG TABS    Sig: Take 1 tablet (100 mg total) by mouth every 2 (two) hours as needed. Maximum '200mg'$  a day.    Dispense:  16 tablet    Refill:  11    6 migraine days a month and <10 total headache days a month. Tried: tylenol, ibuprofen, zofran, imitrex, nurtec, maxalt. Topiramate(cognitive changes), propranolol(hypotension),amitriptyline(sedation).   ondansetron (ZOFRAN-ODT) 4 MG disintegrating tablet    Sig: Take 1-2 tablets (4-8 mg total) by mouth every 8 (eight) hours as needed.    Dispense:  30 tablet    Refill:  3    Cc: Marcellina Millin,  Early, Coralee Pesa, NP  Sarina Ill, MD  [facility] [address] [place], [place] [address]  Phone [phone] Fax [phone]  I spent over 45 minutes of face-to-face and non-face-to-face time with patient on the  1. Migraine without aura and without status migrainosus, not intractable     diagnosis.  This included previsit  chart review, lab review, study review, order entry, electronic health record documentation, patient education  on the different diagnostic and therapeutic options, counseling and coordination of care, risks and benefits of management, compliance, or risk factor reduction

## 2023-01-24 NOTE — Telephone Encounter (Signed)
Pt scheduled for vv with Megan for 08/15/23 at 2:15pm and pt is using BCBS and medicaid and no longer using [place]

## 2023-01-24 NOTE — Telephone Encounter (Signed)
Please schedule video in 6 months with Ward Givens also call patient and ask her about her insurance and ensure its up to date she does not have medcost she has bc/bs and that's not showing up

## 2023-01-27 DIAGNOSIS — Z419 Encounter for procedure for purposes other than remedying health state, unspecified: Secondary | ICD-10-CM | POA: Diagnosis not present

## 2023-01-29 NOTE — Telephone Encounter (Signed)
Pt was switched to different medication

## 2023-02-16 ENCOUNTER — Telehealth: Payer: Self-pay | Admitting: *Deleted

## 2023-02-16 NOTE — Telephone Encounter (Signed)
Roselyn Meier PA received from [facility]. KEY: [place]

## 2023-02-22 NOTE — Telephone Encounter (Signed)
Patient Advocate Encounter  Prior Authorization for Beverly Norris 100MG  tablet has been approved through [place]     Effective: 02-22-2023 to 05-17-2023

## 2023-02-22 NOTE — Telephone Encounter (Signed)
Patient Advocate Encounter   Received notification that prior authorization for Ubrelvy 100MG  tablet is required.   PA submitted on 02/22/2023 Key [id number]  (Notice different Key than below due to first one expired) Insurance [employer] [place] Commercial Electronic Request Form Status is pending       Lyndel Safe, [place] Patient Advocate Specialist [place] Patient Advocate Team Direct Number: [phone]  Fax: [phone]

## 2023-02-27 DIAGNOSIS — Z419 Encounter for procedure for purposes other than remedying health state, unspecified: Secondary | ICD-10-CM | POA: Diagnosis not present

## 2023-03-13 MED ORDER — AMPHETAMINE-DEXTROAMPHET ER 15 MG PO CP24: 15.0000 mg | ORAL_CAPSULE | ORAL | 0 refills | Status: DC

## 2023-03-13 MED ORDER — LISDEXAMFETAMINE DIMESYLATE 30 MG PO CHEW: 30.0000 mg | CHEWABLE_TABLET | Freq: Every day | ORAL | 0 refills | Status: DC

## 2023-03-13 NOTE — Telephone Encounter (Signed)
Can you please call the pharmacy and cancel the Vyvanse prescription. I accidentally sent that instead of the Adderall XR. We DO need adderall, just not the Vyvanse.   Thank you!

## 2023-03-14 NOTE — Telephone Encounter (Signed)
Received a new PA from [facility] due to pt coverage termination on 02/26/23.   New KEY: [id number]

## 2023-03-21 ENCOUNTER — Other Ambulatory Visit (HOSPITAL_COMMUNITY): Payer: Self-pay

## 2023-03-21 ENCOUNTER — Telehealth: Payer: Self-pay

## 2023-03-21 NOTE — Telephone Encounter (Signed)
PA request submitted. New Encounter created for follow up. For additional info see Prior Auth telephone encounter from 03/21/2023.  PA Approved

## 2023-03-21 NOTE — Telephone Encounter (Signed)
Pharmacy Patient Advocate Encounter   Received notification from [employer] that prior authorization for Ubrelvy  tablets is required/requested.    PA submitted on 03/21/2023 to (ins) [facility] Medicaid of [place] via CoverMyMeds Key or [facility]) confirmation # [id number] Status is pending

## 2023-03-21 NOTE — Telephone Encounter (Signed)
Pharmacy Patient Advocate Encounter  Prior Authorization for Bernita Raisin  tablets has been approved by [facility] Medicaid (ins).    PA # PA Case ID: [id number] Effective dates: 03/07/2023 through 03/20/2024

## 2023-03-30 ENCOUNTER — Encounter: Payer: Self-pay | Admitting: Nurse Practitioner

## 2023-04-18 ENCOUNTER — Encounter: Payer: Self-pay | Admitting: Nurse Practitioner

## 2023-04-18 DIAGNOSIS — F320 Major depressive disorder, single episode, mild: Secondary | ICD-10-CM

## 2023-04-18 DIAGNOSIS — F411 Generalized anxiety disorder: Secondary | ICD-10-CM

## 2023-04-19 MED ORDER — SERTRALINE HCL 50 MG PO TABS: 75.0000 mg | ORAL_TABLET | Freq: Every day | ORAL | 3 refills | Status: DC

## 2023-04-26 ENCOUNTER — Telehealth (INDEPENDENT_AMBULATORY_CARE_PROVIDER_SITE_OTHER): Payer: Medicaid Other | Admitting: Nurse Practitioner

## 2023-04-26 ENCOUNTER — Encounter: Payer: Self-pay | Admitting: Nurse Practitioner

## 2023-04-26 VITALS — Wt 165.0 lb

## 2023-04-26 DIAGNOSIS — R41840 Attention and concentration deficit: Secondary | ICD-10-CM | POA: Diagnosis not present

## 2023-04-26 DIAGNOSIS — F411 Generalized anxiety disorder: Secondary | ICD-10-CM

## 2023-04-26 DIAGNOSIS — F320 Major depressive disorder, single episode, mild: Secondary | ICD-10-CM

## 2023-04-26 DIAGNOSIS — G43009 Migraine without aura, not intractable, without status migrainosus: Secondary | ICD-10-CM | POA: Diagnosis not present

## 2023-04-26 MED ORDER — AMPHETAMINE-DEXTROAMPHET ER 20 MG PO CP24: 20.0000 mg | ORAL_CAPSULE | Freq: Every day | ORAL | 0 refills | Status: DC

## 2023-04-26 MED ORDER — ONDANSETRON 4 MG PO TBDP: 4.0000 mg | ORAL_TABLET | Freq: Three times a day (TID) | ORAL | 3 refills | Status: DC | PRN

## 2023-04-26 MED ORDER — SERTRALINE HCL 100 MG PO TABS: 100.0000 mg | ORAL_TABLET | Freq: Every day | ORAL | 3 refills | Status: DC

## 2023-04-26 NOTE — Progress Notes (Signed)
Virtual Visit Encounter mychart visit.   I connected with  Beverly Norris on 04/26/23 at 11:45 AM EDT by secure video and audio telemedicine application. I verified that I am speaking with the correct person using two identifiers.   I introduced myself as a Publishing rights manager with the practice. The limitations of evaluation and management by telemedicine discussed with the patient and the availability of in person appointments. The patient expressed verbal understanding and consent to proceed.  Participating parties in this visit include: Myself and patient  The patient is: Patient Location: Home I am: Provider Location: Office/Clinic Subjective:    CC and HPI: Beverly Norris is a 27 y.o. year old female presenting for follow up of Anxiety and ADHD.  Beverly Norris presents today with the following chief complaints:  ADHD Medication Regarding her ADHD medication, Adderall, she mentions that she takes it only when working and feels it is effective throughout the day. However, she expresses a desire to possibly increase the dose as she believes she might benefit from a higher dosage.  Depression/Anxiety Medication Concerning her medication for depression and anxiety, Zoloft (Sertraline), Beverly Norris feels it is generally working well but sometimes experiences increased depression and anxiety, suggesting that the current dose might be insufficient. She confirms that she has recently picked up a prescription for Zoloft and denies any severe symptoms such as thoughts of self-harm.  Sleep and Other Medications Beverly Norris also mentions that she is able to sleep well despite being on the night shift and taking Adderall. Additionally, she requests a refill for Zofran during the consultation.  Past medical history, Surgical history, Family history not pertinant except as noted below, Social history, Allergies, and medications have been entered into the medical record, reviewed, and corrections  made.   Review of Systems:  All review of systems negative except what is listed in the HPI  Objective:    Alert and oriented x 4 Speaking in clear sentences with no shortness of breath. No distress.  Impression and Recommendations:    Problem List Items Addressed This Visit     Attention and concentration deficit - Primary    The patient reports taking Adderall as needed for work and feels it is effective in lasting throughout the day. She does report the current dose does not seem to be strong enough for complete management and requests and increase, if possible. No reported issues with sleep. Plan: - Increase Adderall regimen to 20mg  daily. - Encourage patient to reach out if dose adjustments are needed.      Relevant Medications   amphetamine-dextroamphetamine (ADDERALL XR) 20 MG 24 hr capsule   amphetamine-dextroamphetamine (ADDERALL XR) 20 MG 24 hr capsule (Start on 05/24/2023)   amphetamine-dextroamphetamine (ADDERALL XR) 20 MG 24 hr capsule (Start on 06/21/2023)   Anxiety disorder    The patient is currently on a low dose of Sertraline (Zoloft) and reports occasional increased depression and anxiety symptoms. No thoughts of self-harm reported. Plan: - Increase Sertraline dose to 100 mg. - Monitor patient's response to the increased dose and adjust as needed. - Encourage patient to reach out if symptoms worsen or if the increased dose is not effective.      Relevant Medications   sertraline (ZOLOFT) 100 MG tablet   Migraine without aura and without status migrainosus, not intractable   Relevant Medications   sertraline (ZOLOFT) 100 MG tablet   ondansetron (ZOFRAN-ODT) 4 MG disintegrating tablet   Other Visit Diagnoses     Depression, major, single episode,  mild (HCC)       Relevant Medications   sertraline (ZOLOFT) 100 MG tablet       orders and follow up as documented in EMR I discussed the assessment and treatment plan with the patient. The patient was  provided an opportunity to ask questions and all were answered. The patient agreed with the plan and demonstrated an understanding of the instructions.   The patient was advised to call back or seek an in-person evaluation if the symptoms worsen or if the condition fails to improve as anticipated.  Follow-Up: in 3 months  I provided 17 minutes of non-face-to-face interaction with this non face-to-face encounter including intake, same-day documentation, and chart review.   Tollie Eth, NP , DNP, AGNP-c [facility]

## 2023-04-26 NOTE — Assessment & Plan Note (Signed)
The patient reports taking Adderall as needed for work and feels it is effective in lasting throughout the day. She does report the current dose does not seem to be strong enough for complete management and requests and increase, if possible. No reported issues with sleep. Plan: - Increase Adderall regimen to 20mg  daily. - Encourage patient to reach out if dose adjustments are needed.

## 2023-04-26 NOTE — Assessment & Plan Note (Signed)
The patient is currently on a low dose of Sertraline (Zoloft) and reports occasional increased depression and anxiety symptoms. No thoughts of self-harm reported. Plan: - Increase Sertraline dose to 100 mg. - Monitor patient's response to the increased dose and adjust as needed. - Encourage patient to reach out if symptoms worsen or if the increased dose is not effective.

## 2023-05-01 ENCOUNTER — Encounter: Payer: Self-pay | Admitting: Nurse Practitioner

## 2023-05-01 ENCOUNTER — Other Ambulatory Visit: Payer: Self-pay

## 2023-05-01 MED ORDER — EPINEPHRINE 0.3 MG/0.3ML IJ SOAJ: 0.3000 mg | INTRAMUSCULAR | 0 refills | Status: DC | PRN

## 2023-05-08 ENCOUNTER — Other Ambulatory Visit (HOSPITAL_COMMUNITY): Payer: Self-pay

## 2023-06-29 ENCOUNTER — Encounter: Payer: Self-pay | Admitting: Nurse Practitioner

## 2023-07-20 ENCOUNTER — Encounter: Payer: Self-pay | Admitting: Nurse Practitioner

## 2023-07-20 ENCOUNTER — Telehealth (INDEPENDENT_AMBULATORY_CARE_PROVIDER_SITE_OTHER): Payer: Self-pay | Admitting: Nurse Practitioner

## 2023-07-20 ENCOUNTER — Other Ambulatory Visit: Payer: Self-pay | Admitting: Nurse Practitioner

## 2023-07-20 VITALS — Wt 165.0 lb

## 2023-07-20 DIAGNOSIS — F418 Other specified anxiety disorders: Secondary | ICD-10-CM

## 2023-07-20 DIAGNOSIS — F3281 Premenstrual dysphoric disorder: Secondary | ICD-10-CM

## 2023-07-20 DIAGNOSIS — R41840 Attention and concentration deficit: Secondary | ICD-10-CM | POA: Diagnosis not present

## 2023-07-20 DIAGNOSIS — F410 Panic disorder [episodic paroxysmal anxiety] without agoraphobia: Secondary | ICD-10-CM | POA: Diagnosis not present

## 2023-07-20 MED ORDER — AMPHETAMINE-DEXTROAMPHET ER 20 MG PO CP24: 20.0000 mg | ORAL_CAPSULE | Freq: Every day | ORAL | 0 refills | Status: DC

## 2023-07-20 MED ORDER — ALPRAZOLAM 0.5 MG PO TABS: 0.2500 mg | ORAL_TABLET | Freq: Two times a day (BID) | ORAL | 2 refills | Status: DC | PRN

## 2023-07-20 MED ORDER — LO LOESTRIN FE 1 MG-10 MCG / 10 MCG PO TABS: 1.0000 | ORAL_TABLET | Freq: Every day | ORAL | 4 refills | Status: DC

## 2023-07-20 NOTE — Progress Notes (Signed)
Virtual Visit Encounter mychart visit.   I connected with  Beverly Norris on 07/29/23 at  1:30 PM EDT by secure video and audio telemedicine application. I verified that I am speaking with the correct person using two identifiers.   I introduced myself as a Publishing rights manager with the practice. The limitations of evaluation and management by telemedicine discussed with the patient and the availability of in person appointments. The patient expressed verbal understanding and consent to proceed.  Participating parties in this visit include: Myself and patient  The patient is: Patient Location: Home I am: Provider Location: Office/Clinic Subjective:    CC and HPI: Beverly Norris is a 27 y.o. year old female presenting for follow up of depression and anxiety. Patient reports the following:  Anxiety/Depression  Beverly Norris tells me that she has noticed improvement in her general anxiety symptoms since starting the sertraline, however, she has felt that her depressed mood and panic attacks have started to increase recently. She reports noticing that this seems to stem around her menstrual cycle with significant changes the week before her period. She would like to know if going on birth control may help regulate the hormonal mood changes.   At this time her ADHD is well controlled with the current medications and she feels that this is helpful for her anxiety   Past medical history, Surgical history, Family history not pertinant except as noted below, Social history, Allergies, and medications have been entered into the medical record, reviewed, and corrections made.   Review of Systems:  All review of systems negative except what is listed in the HPI  Objective:    Alert and oriented x 4 Speaking in clear sentences with no shortness of breath. No distress.  Impression and Recommendations:    Problem List Items Addressed This Visit     Anxiety disorder - Primary     Chronic.  Currently managed with sertraline 100 mg daily.  She has had noted improvement in her general anxiety and depressive symptoms with this medication however she is still is experiencing intermittent panic symptoms.  These do seem to be most associated with her menstrual cycle.  We discussed that hormonal therapy could be helpful in management of this.  We will plan to start oral birth control and add as needed Xanax for panic symptom management.  Will plan to continue to monitor closely and if symptoms worsen she will contact the office for follow-up recommendations.      Relevant Medications   ALPRAZolam (XANAX) 0.5 MG tablet   Attention and concentration deficit   Relevant Medications   amphetamine-dextroamphetamine (ADDERALL XR) 20 MG 24 hr capsule   amphetamine-dextroamphetamine (ADDERALL XR) 20 MG 24 hr capsule (Start on 08/17/2023)   amphetamine-dextroamphetamine (ADDERALL XR) 20 MG 24 hr capsule (Start on 09/14/2023)   Other Visit Diagnoses     Panic attack       Relevant Medications   ALPRAZolam (XANAX) 0.5 MG tablet   PMDD (premenstrual dysphoric disorder)       Relevant Medications   ALPRAZolam (XANAX) 0.5 MG tablet       orders and follow up as documented in EMR I discussed the assessment and treatment plan with the patient. The patient was provided an opportunity to ask questions and all were answered. The patient agreed with the plan and demonstrated an understanding of the instructions.   The patient was advised to call back or seek an in-person evaluation if the symptoms worsen or if the condition  fails to improve as anticipated.  Follow-Up: 2-3 months  I provided 19 minutes of non-face-to-face interaction with this non face-to-face encounter including intake, same-day documentation, and chart review.   Tollie Eth, NP , DNP, AGNP-c [facility]

## 2023-07-23 MED ORDER — NORETHIN ACE-ETH ESTRAD-FE 1-20 MG-MCG PO TABS: 1.0000 | ORAL_TABLET | Freq: Every day | ORAL | 3 refills | Status: DC

## 2023-07-29 ENCOUNTER — Encounter: Payer: Self-pay | Admitting: Nurse Practitioner

## 2023-07-29 NOTE — Assessment & Plan Note (Signed)
Chronic.  Currently managed with sertraline 100 mg daily.  She has had noted improvement in her general anxiety and depressive symptoms with this medication however she is still is experiencing intermittent panic symptoms.  These do seem to be most associated with her menstrual cycle.  We discussed that hormonal therapy could be helpful in management of this.  We will plan to start oral birth control and add as needed Xanax for panic symptom management.  Will plan to continue to monitor closely and if symptoms worsen she will contact the office for follow-up recommendations.

## 2023-08-15 ENCOUNTER — Telehealth: Payer: Self-pay | Admitting: Adult Health

## 2023-09-22 ENCOUNTER — Other Ambulatory Visit: Payer: Self-pay | Admitting: Nurse Practitioner

## 2023-09-22 DIAGNOSIS — R41840 Attention and concentration deficit: Secondary | ICD-10-CM

## 2023-09-22 MED ORDER — AMPHETAMINE-DEXTROAMPHET ER 20 MG PO CP24: 20.0000 mg | ORAL_CAPSULE | Freq: Every day | ORAL | 0 refills | Status: AC

## 2023-09-22 NOTE — Telephone Encounter (Signed)
Last apt 07/10/23

## 2024-03-08 ENCOUNTER — Other Ambulatory Visit (HOSPITAL_COMMUNITY): Payer: Self-pay

## 2024-03-12 ENCOUNTER — Telehealth: Payer: Self-pay

## 2024-03-12 NOTE — Telephone Encounter (Signed)
 Pharmacy Patient Advocate Encounter   Received notification from CoverMyMeds that prior authorization for Ubrelvy 100mg  Tablet is due for renewal.   Insurance verification completed.   The patient is insured through [facility] [place].  Action: Patient hasn't been seen in your office in over a year. Plan requires updated chart notes for PA renewal.

## 2024-03-12 NOTE — Telephone Encounter (Signed)
 This must have been an automatic renewal. Pt canceled her appt in September 2024 here due to insurance. She has not rescheduled with us . Please disregard PA request in this case.

## 2024-06-01 ENCOUNTER — Other Ambulatory Visit: Payer: Self-pay | Admitting: Nurse Practitioner

## 2024-06-30 ENCOUNTER — Ambulatory Visit (HOSPITAL_BASED_OUTPATIENT_CLINIC_OR_DEPARTMENT_OTHER)
Admission: EM | Admit: 2024-06-30 | Discharge: 2024-06-30 | Disposition: A | Discharge status: Home / Self Care | Payer: PRIVATE HEALTH INSURANCE

## 2024-06-30 ENCOUNTER — Encounter (HOSPITAL_BASED_OUTPATIENT_CLINIC_OR_DEPARTMENT_OTHER): Payer: Self-pay

## 2024-06-30 DIAGNOSIS — S70362A Insect bite (nonvenomous), left thigh, initial encounter: Secondary | ICD-10-CM

## 2024-06-30 DIAGNOSIS — W57XXXA Bitten or stung by nonvenomous insect and other nonvenomous arthropods, initial encounter: Secondary | ICD-10-CM

## 2024-06-30 MED ORDER — SULFAMETHOXAZOLE-TRIMETHOPRIM 800-160 MG PO TABS: 1.0000 | ORAL_TABLET | Freq: Two times a day (BID) | ORAL | 0 refills | Status: AC

## 2024-06-30 NOTE — ED Triage Notes (Signed)
 Believes might have been bit by a spider or other stinging insect to interior left thigh just above back of knee. Raised area with raised center visible.

## 2024-06-30 NOTE — Discharge Instructions (Addendum)
 Take the antibiotics as prescribed  Warm compresses.  Follow up if worse.

## 2024-07-03 NOTE — ED Provider Notes (Signed)
 [facility]    CSN: [id number] Arrival date & time: 06/30/24  1508      History   Chief Complaint Chief Complaint  Patient presents with   Insect Bite    HPI Beverly Norris is a 28 y.o. female.   27 year old female that presents with possible insect bite. Believes might have been bit by a spider or other stinging insect to interior left thigh just above back of knee. Raised area with raised center visible. Area is red, warm and painful. Feels like the pain is spreading. No fever      Past Medical History:  Diagnosis Date   Closed displaced fracture of neck of right fourth metacarpal bone    COVID 07/17/2020   body aches chills loss of taste and smell x 5 days all symptoms reolved   History of UTI 02/12/2021   Supervision of low-risk first pregnancy, third trimester 12/31/2018   Wears glasses     Patient Active Problem List   Diagnosis Date Noted   Attention and concentration deficit 11/03/2022   Anxiety disorder 11/03/2022   Family history of hyperlipidemia 04/08/2022   Body mass index (BMI) of 28.0-28.9 in adult 04/08/2022   Migraine without aura and without status migrainosus, not intractable 03/09/2021   Chronic left-sided thoracic back pain 02/12/2021   Chronic nonintractable headache 02/12/2021    Past Surgical History:  Procedure Laterality Date   WISDOM TOOTH EXTRACTION  2015    OB History     Gravida  2   Para  1   Term  1   Preterm      AB  1   Living  1      SAB      IAB  1   Ectopic      Multiple  0   Live Births  1            Home Medications    Prior to Admission medications   Medication Sig Start Date End Date Taking? Authorizing Provider  escitalopram (LEXAPRO) 20 MG tablet Take 20 mg by mouth daily. 06/28/24  Yes [provider]  sulfamethoxazole -trimethoprim  (BACTRIM  DS) 800-160 MG tablet Take 1 tablet by mouth 2 (two) times daily for 7 days. 06/30/24 07/07/24 Yes Shaquel Josephson A, FNP   acetaminophen  (TYLENOL ) 500 MG tablet Take 500-1,000 mg by mouth every 6 (six) hours as needed for mild pain or headache.    [provider]  amphetamine -dextroamphetamine  (ADDERALL XR) 20 MG 24 hr capsule Take 1 capsule (20 mg total) by mouth daily. 09/22/23   Early, Sara E, NP  aspirin-acetaminophen -caffeine (EXCEDRIN MIGRAINE) 250-250-65 MG tablet Take 1 tablet by mouth every 6 (six) hours as needed for headache or migraine.    [provider]  EPINEPHRINE  0.3 mg/0.3 mL IJ SOAJ injection INJECT 0.3 MG INTO THE MUSCLE AS NEEDED FOR ANAPHYLAXIS. 06/03/24   Early, Sara E, NP  ondansetron  (ZOFRAN -ODT) 4 MG disintegrating tablet Take 1-2 tablets (4-8 mg total) by mouth every 8 (eight) hours as needed. Patient not taking: Reported on 07/20/2023 04/26/23   Early, Camie BRAVO, NP    Family History Family History  Problem Relation Age of Onset   Diabetes Mother    Migraines Father    Diabetes Maternal Grandmother    Stroke Maternal Grandmother    Hypertension Maternal Grandmother    Hyperlipidemia Maternal Grandmother    Diabetes Maternal Grandfather    Kidney disease Maternal Grandfather    Heart attack Maternal  Grandfather    Hypertension Maternal Grandfather    Hyperlipidemia Maternal Grandfather    Diabetes Paternal Grandmother    Diabetes Paternal Grandfather    Hypertension Paternal Grandfather    Hyperlipidemia Paternal Grandfather    Colon cancer Neg Hx    Breast cancer Neg Hx     Social History Social History   Tobacco Use   Smoking status: Never   Smokeless tobacco: Never  Vaping Use   Vaping status: Never Used  Substance Use Topics   Alcohol use: Yes    Comment: occasional    Drug use: Never     Allergies   Bee venom, Other, and Yellow jacket venom   Review of Systems Review of Systems See HPI  Physical Exam Triage Vital Signs ED Triage Vitals  Encounter Vitals Group     BP 06/30/24 1518 128/88     Girls Systolic BP Percentile --      Girls  Diastolic BP Percentile --      Boys Systolic BP Percentile --      Boys Diastolic BP Percentile --      Pulse Rate 06/30/24 1518 68     Resp 06/30/24 1518 20     Temp 06/30/24 1518 97.8 F (36.6 C)     Temp Source 06/30/24 1518 Oral     SpO2 06/30/24 1518 97 %     Weight --      Height --      Head Circumference --      Peak Flow --      Pain Score 06/30/24 1520 4     Pain Loc --      Pain Education --      Exclude from Growth Chart --    No data found.  Updated Vital Signs BP 128/88 (BP Location: Right Arm)   Pulse 68   Temp 97.8 F (36.6 C) (Oral)   Resp 20   LMP 06/08/2024   SpO2 97%   Visual Acuity Right Eye Distance:   Left Eye Distance:   Bilateral Distance:    Right Eye Near:   Left Eye Near:    Bilateral Near:     Physical Exam Vitals and nursing note reviewed.  Constitutional:      General: She is not in acute distress.    Appearance: Normal appearance. She is not ill-appearing, toxic-appearing or diaphoretic.  Pulmonary:     Effort: Pulmonary effort is normal.  Skin:    General: Skin is warm and dry.     Comments: Raised red bump to the left upper inner thigh with redness extending. Swollen and TTP   Neurological:     Mental Status: She is alert.  Psychiatric:        Mood and Affect: Mood normal.      UC Treatments / Results  Labs (all labs ordered are listed, but only abnormal results are displayed) Labs Reviewed - No data to display  EKG   Radiology No results found.  Procedures Procedures (including critical care time)  Medications Ordered in UC Medications - No data to display  Initial Impression / Assessment and Plan / UC Course  I have reviewed the triage vital signs and the nursing notes.  Pertinent labs & imaging results that were available during my care of the patient were reviewed by me and considered in my medical decision making (see chart for details).     Insect bite- concern due to worsening redness and pain.  Treating for cellulitis  at this time. Treating with Bactrim .  Warm compresses.  Follow up as needed.  Final Clinical Impressions(s) / UC Diagnoses   Final diagnoses:  Insect bite of left thigh, initial encounter     Discharge Instructions      Take the antibiotics as prescribed  Warm compresses.  Follow up if worse.      ED Prescriptions     Medication Sig Dispense Auth. Provider   sulfamethoxazole -trimethoprim  (BACTRIM  DS) 800-160 MG tablet Take 1 tablet by mouth 2 (two) times daily for 7 days. 14 tablet Adah Wilbert LABOR, FNP      PDMP not reviewed this encounter.   Adah Wilbert LABOR, FNP 07/03/24 [phone]

## 2024-09-28 ENCOUNTER — Encounter (HOSPITAL_BASED_OUTPATIENT_CLINIC_OR_DEPARTMENT_OTHER): Payer: Self-pay

## 2024-09-28 ENCOUNTER — Ambulatory Visit (HOSPITAL_BASED_OUTPATIENT_CLINIC_OR_DEPARTMENT_OTHER)
Admission: EM | Admit: 2024-09-28 | Discharge: 2024-09-28 | Disposition: A | Discharge status: Home / Self Care | Payer: PRIVATE HEALTH INSURANCE

## 2024-09-28 DIAGNOSIS — A084 Viral intestinal infection, unspecified: Secondary | ICD-10-CM | POA: Diagnosis not present

## 2024-09-28 DIAGNOSIS — R112 Nausea with vomiting, unspecified: Secondary | ICD-10-CM

## 2024-09-28 DIAGNOSIS — G43009 Migraine without aura, not intractable, without status migrainosus: Secondary | ICD-10-CM

## 2024-09-28 MED ORDER — ONDANSETRON 4 MG PO TBDP: 4.0000 mg | ORAL_TABLET | Freq: Three times a day (TID) | ORAL | 0 refills | Status: AC | PRN

## 2024-09-28 NOTE — ED Provider Notes (Signed)
 [facility]    CSN: [id number] Arrival date & time: 09/28/24  1209      History   Chief Complaint Chief Complaint  Patient presents with   Fever   Nausea   Emesis    HPI Beverly Norris is a 28 y.o. female.   28 year old female with report of nausea, vomiting and diarrhea since 09/27/2024.  She had a fever of 100.5 this morning.  She did a home COVID flu test that was negative this morning.  Has taken ibuprofen  ondansetron  for her symptoms and that helped some.  She is persistently nauseous.  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   Fever Associated symptoms: diarrhea, nausea and vomiting   Associated symptoms: no chest pain, no chills, no cough, no dysuria, no ear pain, no rash and no sore throat   Emesis Associated symptoms: diarrhea and fever   Associated symptoms: no abdominal pain, no arthralgias, no chills, no cough and no sore throat     Past Medical History:  Diagnosis Date   Closed displaced fracture of neck of right fourth metacarpal bone    COVID 07/17/2020   body aches chills loss of taste and smell x 5 days all symptoms reolved   History of UTI 02/12/2021   Supervision of low-risk first pregnancy, third trimester 12/31/2018   Wears glasses     Patient Active Problem List   Diagnosis Date Noted   Attention and concentration deficit 11/03/2022   Anxiety disorder 11/03/2022   Family history of hyperlipidemia 04/08/2022   Body mass index (BMI) of 28.0-28.9 in adult 04/08/2022   Migraine without aura and without status migrainosus, not intractable 03/09/2021   Chronic left-sided thoracic back pain 02/12/2021   Chronic nonintractable headache 02/12/2021    Past Surgical History:  Procedure Laterality Date   WISDOM TOOTH EXTRACTION  2015    OB History     Gravida  2   Para  1   Term  1   Preterm      AB  1   Living  1      SAB      IAB  1   Ectopic      Multiple  0   Live Births  1             Home Medications    Prior to Admission medications   Medication Sig Start Date End Date Taking? Authorizing Provider  acetaminophen  (TYLENOL ) 500 MG tablet Take 500-1,000 mg by mouth every 6 (six) hours as needed for mild pain or headache.   Yes [provider]  amphetamine -dextroamphetamine  (ADDERALL XR) 20 MG 24 hr capsule Take 1 capsule (20 mg total) by mouth daily. 09/22/23  Yes Early, Sara E, NP  buPROPion (WELLBUTRIN XL) 150 MG 24 hr tablet Take 150 mg by mouth every morning. 09/23/24  Yes [provider]  EPINEPHRINE  0.3 mg/0.3 mL IJ SOAJ injection INJECT 0.3 MG INTO THE MUSCLE AS NEEDED FOR ANAPHYLAXIS. 06/03/24  Yes Early, Sara E, NP  escitalopram (LEXAPRO) 20 MG tablet Take 20 mg by mouth daily. 06/28/24  Yes [provider]  ondansetron  (ZOFRAN -ODT) 4 MG disintegrating tablet Take 1 tablet (4 mg total) by mouth every 8 (eight) hours as needed for nausea or vomiting. 09/28/24  Yes Ival Domino, FNP  aspirin-acetaminophen -caffeine (EXCEDRIN MIGRAINE) 250-250-65 MG tablet Take 1 tablet by mouth every 6 (six) hours as needed for headache or migraine.  [provider]    Family History Family History  Problem Relation Age of Onset   Diabetes Mother    Migraines Father    Diabetes Maternal Grandmother    Stroke Maternal Grandmother    Hypertension Maternal Grandmother    Hyperlipidemia Maternal Grandmother    Diabetes Maternal Grandfather    Kidney disease Maternal Grandfather    Heart attack Maternal Grandfather    Hypertension Maternal Grandfather    Hyperlipidemia Maternal Grandfather    Diabetes Paternal Grandmother    Diabetes Paternal Grandfather    Hypertension Paternal Grandfather    Hyperlipidemia Paternal Grandfather    Colon cancer Neg Hx    Breast cancer Neg Hx     Social History Social History   Tobacco Use   Smoking status: Never   Smokeless tobacco: Never  Vaping Use   Vaping status: Never Used   Substance Use Topics   Alcohol use: Not Currently    Comment: occasional    Drug use: Never     Allergies   Bee venom, Other, and Yellow jacket venom   Review of Systems Review of Systems  Constitutional:  Positive for fever. Negative for chills.  HENT:  Negative for ear pain and sore throat.   Eyes:  Negative for pain and visual disturbance.  Respiratory:  Negative for cough and shortness of breath.   Cardiovascular:  Negative for chest pain and palpitations.  Gastrointestinal:  Positive for diarrhea, nausea and vomiting. Negative for abdominal pain and constipation.  Genitourinary:  Negative for dysuria and hematuria.  Musculoskeletal:  Negative for arthralgias and back pain.  Skin:  Negative for color change and rash.  Neurological:  Negative for seizures and syncope.  All other systems reviewed and are negative.    Physical Exam Triage Vital Signs ED Triage Vitals  Encounter Vitals Group     BP 09/28/24 1239 116/77     Girls Systolic BP Percentile --      Girls Diastolic BP Percentile --      Boys Systolic BP Percentile --      Boys Diastolic BP Percentile --      Pulse Rate 09/28/24 1239 72     Resp --      Temp 09/28/24 1239 98.7 F (37.1 C)     Temp Source 09/28/24 1239 Oral     SpO2 09/28/24 1239 96 %     Weight --      Height --      Head Circumference --      Peak Flow --      Pain Score 09/28/24 1235 2     Pain Loc --      Pain Education --      Exclude from Growth Chart --    No data found.  Updated Vital Signs BP 116/77 (BP Location: Right Arm)   Pulse 72   Temp 98.7 F (37.1 C) (Oral)   LMP 09/16/2024 (Exact Date)   SpO2 96%   Visual Acuity Right Eye Distance:   Left Eye Distance:   Bilateral Distance:    Right Eye Near:   Left Eye Near:    Bilateral Near:     Physical Exam Vitals and nursing note reviewed.  Constitutional:      General: She is not in acute distress.    Appearance: She is well-developed. She is ill-appearing.  She is not toxic-appearing or diaphoretic.  HENT:     Head: Normocephalic and atraumatic.     Right Ear:  Hearing, tympanic membrane, ear canal and external ear normal.     Left Ear: Hearing, tympanic membrane, ear canal and external ear normal.     Nose: No congestion or rhinorrhea.     Right Sinus: No maxillary sinus tenderness or frontal sinus tenderness.     Left Sinus: No maxillary sinus tenderness or frontal sinus tenderness.     Mouth/Throat:     Lips: Pink.     Mouth: Mucous membranes are moist.     Pharynx: Uvula midline. No oropharyngeal exudate or posterior oropharyngeal erythema.     Tonsils: No tonsillar exudate.  Eyes:     Conjunctiva/sclera: Conjunctivae normal.     Pupils: Pupils are equal, round, and reactive to light.  Cardiovascular:     Rate and Rhythm: Normal rate and regular rhythm.     Heart sounds: S1 normal and S2 normal. No murmur heard. Pulmonary:     Effort: Pulmonary effort is normal. No respiratory distress.     Breath sounds: Normal breath sounds. No decreased breath sounds, wheezing, rhonchi or rales.  Abdominal:     General: Bowel sounds are normal.     Palpations: Abdomen is soft.     Tenderness: There is abdominal tenderness (Very mild lower abdominal pain and some mild epigastric pain) in the right lower quadrant, epigastric area, suprapubic area and left lower quadrant. There is no right CVA tenderness, left CVA tenderness, guarding or rebound. Negative signs include Murphy's sign, Rovsing's sign and McBurney's sign.  Musculoskeletal:        General: No swelling.     Cervical back: Neck supple.  Lymphadenopathy:     Head:     Right side of head: No submental, submandibular, tonsillar, preauricular or posterior auricular adenopathy.     Left side of head: No submental, submandibular, tonsillar, preauricular or posterior auricular adenopathy.     Cervical: No cervical adenopathy.     Right cervical: No superficial cervical adenopathy.    Left  cervical: No superficial cervical adenopathy.  Skin:    General: Skin is warm and dry.     Capillary Refill: Capillary refill takes less than 2 seconds.     Findings: No rash.  Neurological:     Mental Status: She is alert and oriented to person, place, and time.  Psychiatric:        Mood and Affect: Mood normal.      UC Treatments / Results  Labs (all labs ordered are listed, but only abnormal results are displayed) Labs Reviewed - No data to display  EKG   Radiology No results found.  Procedures Procedures (including critical care time)  Medications Ordered in UC Medications - No data to display  Initial Impression / Assessment and Plan / UC Course  I have reviewed the triage vital signs and the nursing notes.  Pertinent labs & imaging results that were available during my care of the patient were reviewed by me and considered in my medical decision making (see chart for details).  Plan of Care: Viral gastroenteritis with nausea and vomiting: Use ondansetron , 4 mg ODT, melts on tongue, every 8 hours, as needed for nausea and vomiting.  Encouraged hydration.  Discussed dietary restrictions associated with vomiting and then advancing diet as tolerated.  Get plenty of fluids and work on hydration.  It may be too soon to be positive for COVID.  Encouraged to retest for COVID and flu in the afternoon of 09/29/24.  Work excuse provided.  Follow-up if symptoms do  not improve, worsen or new symptoms occur.  I reviewed the plan of care with the patient and/or the patient's guardian.  The patient and/or guardian had time to ask questions and acknowledged that the questions were answered.  I provided instruction on symptoms or reasons to return here or to go to an ER, if symptoms/condition did not improve, worsened or if new symptoms occurred.  Final Clinical Impressions(s) / UC Diagnoses   Final diagnoses:  Viral gastroenteritis  Nausea and vomiting, unspecified vomiting type      Discharge Instructions      Viral gastroenteritis with nausea and vomiting: Use ondansetron , 4 mg ODT, melts on tongue, every 8 hours, as needed for nausea and vomiting.  Encouraged hydration.  Discussed dietary restrictions associated with vomiting and then advancing diet as tolerated.  Get plenty of fluids and work on hydration.  It may be too soon to be positive for COVID.  Encouraged to retest for COVID and flu in the afternoon of 09/29/24.  Work excuse provided.  Follow-up if symptoms do not improve, worsen or new symptoms occur.     ED Prescriptions     Medication Sig Dispense Auth. Provider   ondansetron  (ZOFRAN -ODT) 4 MG disintegrating tablet Take 1 tablet (4 mg total) by mouth every 8 (eight) hours as needed for nausea or vomiting. 20 tablet Edla Para, FNP      PDMP not reviewed this encounter.   Ival Domino, FNP 09/28/24 1316

## 2024-09-28 NOTE — ED Triage Notes (Signed)
 Pt c/o of nausea and diarrhea since yesterday. Vomiting today. Fever this morning. Took at home flu/covid test was negative this morning. Pt has taken otc ibuprofen  and Zofran .

## 2024-09-28 NOTE — Discharge Instructions (Addendum)
 Viral gastroenteritis with nausea and vomiting: Use ondansetron , 4 mg ODT, melts on tongue, every 8 hours, as needed for nausea and vomiting.  Encouraged hydration.  Discussed dietary restrictions associated with vomiting and then advancing diet as tolerated.  Get plenty of fluids and work on hydration.  It may be too soon to be positive for COVID.  Encouraged to retest for COVID and flu in the afternoon of 09/29/24.  Work excuse provided.  Follow-up if symptoms do not improve, worsen or new symptoms occur.
